# Patient Record
Sex: Male | Born: 1980 | Race: White | Hispanic: No | Marital: Married | Smoking: Never smoker
Health system: Southern US, Community
[De-identification: ages and names within clinical notes are randomized; demographics above are authoritative.]

## PROBLEM LIST (undated history)

## (undated) DIAGNOSIS — J45909 Unspecified asthma, uncomplicated: Secondary | ICD-10-CM

## (undated) HISTORY — PX: HERNIA REPAIR: SHX51

## (undated) HISTORY — PX: VASECTOMY: SHX75

---

## 2010-07-10 NOTE — H&P (Addendum)
[facility]                 [address] [place], [place]   [address]                                   PREOPERATIVE                        HISTORY   PHYSICAL EXAMINATION    PATIENT:      [name] [name], [name] [name]  MRN:              [id number]     ADMITTED: 07/20/2010  BILLING:          [id number]    LOCATION:  ATTENDING:    Maurie Boettcher, MD  DICTATING:    Maurie Boettcher, MD      HISTORY OF PRESENT ILLNESS: The patient is a 30 year old male who, about 2  weeks ago, went on a run. When he got back from this run and was leaning  against the counter, he felt a sore area in the right groin. When he looked  down, he was able to see a bulge or a lump on that side. When he laid down  and applied pressure, this did go away. He has not been noting this as  much, but he does say whenever it is sore, he has been holding pressure, so  he feels like he may not be seeing any problems.    PAST MEDICAL HISTORY: None.    PAST SURGICAL HISTORY: None.    MEDICATIONS: Claritin-D as needed.    ALLERGIES: THE PATIENT HAS NO KNOWN DRUG ALLERGIES.    SOCIAL HISTORY: He is running and hoping to train for marathons. He does  use chewing tobacco, but no smoked tobacco. He does not drink alcohol. He  is married and has a 81-month-old baby.    FAMILY HISTORY: Significant for a father with diabetes. He tells me that  his father has a very thin stature as well, so he has always been watched  close. On his father's side, there are a lot of people with adult onset  diabetes.    REVIEW OF SYSTEMS  GASTROINTESTINAL: No problems with his bowels.  CONSTITUTIONAL: His weight has been stable.  INTEGUMENTARY: He does have a rash to the sides of his nose.  HEENT: No problems with vision.  CARDIAC: No chest pain or heart problems.  PULMONARY: No cough or shortness of breath.  GENITOURINARY: He does have a history of kidney stones which he says   occurred during [employer] training when he had a 40 pound weight loss at the  same time. He has not had any BPH symptoms.  MUSCULOSKELETAL: No arthritis.  ENDOCRINE: No history of diabetes personally.  HEMATOLOGIC: No problems with bruising or bleeding.  NEUROLOGIC: No dizziness or headaches.    PHYSICAL EXAMINATION  VITAL SIGNS: Temperature 98.1, with a heart rate 70, a blood pressure  120/70, with height 6 feet, weight 170 pounds, and a BMI of 23.  GENERAL: The patient is a thin, Caucasian male in no apparent distress.  HEAD: Normocephalic and atraumatic.  NECK: No masses or adenopathy.  SKIN: Does reveal a reddish rash to either side of the nose, which is only  a couple of centimeters on each side.  CARDIAC: Regular on exam.  PULMONARY: Clear to auscultation bilaterally.  ABDOMEN:  Nontender with the exception of when he coughs with pressure in  the right groin area. There is a bulge which does not go all the way down  to the scrotum. On the left hand side, there is no tenderness and no bulge  identified.  EXTREMITIES: No evidence of edema.    ASSESSMENT AND PLAN: The patient is a 30 year old male with a right  inguinal hernia. I had a long conversation with him about the options  between an open or a laparoscopic repair. He really prefers to proceed with  a laparoscopic repair knowing the risks of bleeding, infection, numbness,  pain, and recurrence specifically. Occasionally, this cannot be done  laparoscopically, and does need to be done open. I did discuss with him  that in the future, should he have a left inguinal hernia, that sometimes  it is not possible to proceed laparoscopically, after having had a  right-sided repair. The patient understands and wants to proceed as soon as  possible. I did discuss with him that this is done under a general  anesthetic and a Foley catheter is used so that he would have to void prior  to going home. I did encourage him to take at least a week or 2 off work,   but he really prefers to have this done and try to proceed to work the  following week if possible. I did discuss with him the risks of oral  tobacco and recommended cessation. His skin rash on his face does have an  appearance of possible rosacea which should not prevent proceeding with  surgery. He will follow up with his primary care to re-evaluate this.                           Electronically Signed       Maurie Boettcher, MD 07/12/2010 14:53              Maurie Boettcher, MD    VAS:wmx  D: 07/10/2010  9:37 A T: 07/10/2010  9:55 A  Job#:  [id number]  CScriptDoc #:  [id number]  cc:   Maurie Boettcher, MD

## 2010-07-20 NOTE — Op Note (Signed)
[facility]                  [address] [place], [place]  [address]                                 OPERATIVE REPORT    PATIENT:     [name], [name]  MRN              [id number]   DATE:       07/20/2010  BILLING:         [id number]  LOCATION:  ATTENDING:   Maurie Boettcher, MD  SURGEON:     [name] Boettcher, MD      PREOPERATIVE DIAGNOSIS: Right inguinal hernia.    POSTOPERATIVE DIAGNOSIS: Right inguinal hernia.    PROCEDURE: Laparoscopic right inguinal herniorrhaphy.    SURGEON: [name] Boettcher, MD    ASSISTANT: [name] [name], SA    ANESTHESIA: General endotracheal anesthesia.    FINDINGS: Indirect hernia.    COMPLICATIONS: None.    PATHOLOGIC SPECIMENS: None.    ESTIMATED BLOOD LOSS: Minimal.    IV FLUIDS:  1400 mL.    URINE OUTPUT: 130 mL.    INDICATIONS: The patient is a 30 year old male who about 2 weeks ago  noticed soreness and a bulge in the right groin. Whenever the area is sore  and he holds pressure on it, it seems to resolve. He did have a hernia  noted on exam. We discussed the option of open versus laparoscopic repair,  and he preferred to proceed with a laparoscopic herniorrhaphy, knowing the  risks of bleeding, infection, injury, numbness, pain, recurrence, and  possible need to open.    DESCRIPTION OF PROCEDURE: The patient was taken to the operating room and  placed supine on the operating room table. Sequential compression devices  were applied. Both arms were padded and tucked. A general endotracheal  anesthetic was induced. The hair around the umbilicus and down to the pubic  bone was clipped and a Foley catheter was placed. He was prepped with  Chloraprep and draped in a sterile fashion. Marcaine 0.5% with epinephrine  was infused as an ilioinguinal nerve block. A time-out was held and all  agreed with the plan, patient, and procedure.    Marcaine 0.5% with epinephrine was infused to the right of the umbilicus.  An incision was made. We  dissected down to the level of the fascia and  opened this. The rectus muscle was retracted laterally. The dissecting  balloon was inserted into the preperitoneal space and expanded on the right  hand side.    At this point, the structural balloon was inserted, and it was expanded.  The preperitoneal space was insufflated to a pressure of 12. We did  position the patient head down. Following local, two 5 mm trocars were  placed in the midline inferior to this.  I did have to cauterize a  subcutaneous bleed prior to placing one of the trocars.    I dissected out the lateral space. I dissected off the pubic tubercle and  down to the Cooper's ligament. At this point, I began to dissect out the  hernia sac. This was fairly scarred to the cord structures, but I was able  to clear around this and bring this down proximally. It did appear to be  tethered. As I examined this area where it was tethered, it  was very clear  that this was not a hernia and was the peritoneum going down to the pelvis.  During the dissection there was an opening in the peritoneum noted. There  was nothing noted in the direct space.    I did take the 3 x 6 polypropylene mesh and marked this and cut a vertical  slit. This was placed in the preperitoneal space. I did place this around  the cord structures. A tack was placed to the pubic tubercle and down along  Cooper's ligament. Tacks were placed from the midline over to the  epigastrics and the lateral flap was wrapped around and tacked on either  side of the epigastrics as well. This created a new internal ring.    At this point, the opening in the peritoneum was large enough that a loop  of small bowel was visible. I was not comfortable with not closing the  peritoneum as I was concerned the loop of bowel might be in contact with  the mesh. I, therefore, took a 3-0 Vicryl stitch and laparoscopically ran  the Vicryl stitch to reapproximate the peritoneum.    At this point, the mesh was held in  place. I did evacuate the gas from the  preperitoneal space. Our mesh was in good position. At this point our  structural balloon was removed.    An 0 Vicryl UR-6 stitch was used to close the fascia. I then infused  additional local using a total of 30 mL. Some of this had been infused into  the preperitoneal space as well. 4-0 Monocryl buried interrupted in running  subcuticular stitches were used to close the skin followed by alcohol,  Steri-Strips, 4 x 4 and Medipore tape. The Foley catheter was removed.  There was noted to be a little bit of subcutaneous air on the right hand  side of his abdomen. He went to the recovery room in a stable condition.                        Electronically Signed      [name] Boettcher, MD 07/26/2010 10:54                                     [name] Boettcher, MD    VAS:WMX  D: 07/20/2010 12:02 P T: 07/20/2010 12:30 P  Job#:  [id number]  CScriptDoc #:  [id number]  cc:   [name] Boettcher, MD

## 2010-07-20 NOTE — Op Note (Signed)
[facility]                  [address], [place], [place]  [address]                                 OPERATIVE REPORT    PATIENT:     [name] [name], [name] [name]  MRN              [id number]   DATE:       07/20/2010  BILLING:         [id number]  LOCATION:  ATTENDING:   Maurie Boettcher, MD  SURGEON:     Maurie Boettcher, MD      PREOPERATIVE DIAGNOSIS: Right inguinal hernia.    POSTOPERATIVE DIAGNOSIS: Right inguinal hernia.    PROCEDURE: Laparoscopic right inguinal herniorrhaphy.    SURGEON: Maurie Boettcher, MD    ASSISTANT: Guss Bunde, SA    ANESTHESIA: General endotracheal anesthesia.    FINDINGS: Indirect hernia.    COMPLICATIONS: None.    PATHOLOGIC SPECIMENS: None.    ESTIMATED BLOOD LOSS: Minimal.    IV FLUIDS:  1400 mL.    URINE OUTPUT: 130 mL.    INDICATIONS: The patient is a 30 year old male who about 2 weeks ago  noticed soreness and a bulge in the right groin. Whenever the area is sore  and he holds pressure on it, it seems to resolve. He did have a hernia  noted on exam. We discussed the option of open versus laparoscopic repair,  and he preferred to proceed with a laparoscopic herniorrhaphy, knowing the  risks of bleeding, infection, injury, numbness, pain, recurrence, and  possible need to open.    DESCRIPTION OF PROCEDURE: The patient was taken to the operating room and  placed supine on the operating room table. Sequential compression devices  were applied. Both arms were padded and tucked. A general endotracheal  anesthetic was induced. The hair around the umbilicus and down to the pubic  bone was clipped and a Foley catheter was placed. He was prepped with  Chloraprep and draped in a sterile fashion. Marcaine 0.5% with epinephrine  was infused as an ilioinguinal nerve block. A time-out was held and all  agreed with the plan, patient, and procedure.    Marcaine 0.5% with epinephrine was infused to the right of the umbilicus.   An incision was made. We dissected down to the level of the fascia and  opened this. The rectus muscle was retracted laterally. The dissecting  balloon was inserted into the preperitoneal space and expanded on the right  hand side.    At this point, the structural balloon was inserted, and it was expanded.  The preperitoneal space was insufflated to a pressure of 12. We did  position the patient head down. Following local, two 5 mm trocars were  placed in the midline inferior to this.  I did have to cauterize a  subcutaneous bleed prior to placing one of the trocars.    I dissected out the lateral space. I dissected off the pubic tubercle and  down to the Cooper's ligament. At this point, I began to dissect out the  hernia sac. This was fairly scarred to the cord structures, but I was able  to clear around this and bring this down proximally. It did appear to be  tethered. As I examined this area where it was tethered, it  was very clear  that this was not a hernia and was the peritoneum going down to the pelvis.  During the dissection there was an opening in the peritoneum noted. There  was nothing noted in the direct space.    I did take the 3 x 6 polypropylene mesh and marked this and cut a vertical  slit. This was placed in the preperitoneal space. I did place this around  the cord structures. A tack was placed to the pubic tubercle and down along  Cooper's ligament. Tacks were placed from the midline over to the  epigastrics and the lateral flap was wrapped around and tacked on either  side of the epigastrics as well. This created a new internal ring.    At this point, the opening in the peritoneum was large enough that a loop  of small bowel was visible. I was not comfortable with not closing the  peritoneum as I was concerned the loop of bowel might be in contact with  the mesh. I, therefore, took a 3-0 Vicryl stitch and laparoscopically ran  the Vicryl stitch to reapproximate the peritoneum.     At this point, the mesh was held in place. I did evacuate the gas from the  preperitoneal space. Our mesh was in good position. At this point our  structural balloon was removed.    An 0 Vicryl UR-6 stitch was used to close the fascia. I then infused  additional local using a total of 30 mL. Some of this had been infused into  the preperitoneal space as well. 4-0 Monocryl buried interrupted in running  subcuticular stitches were used to close the skin followed by alcohol,  Steri-Strips, 4 x 4 and Medipore tape. The Foley catheter was removed.  There was noted to be a little bit of subcutaneous air on the right hand  side of his abdomen. He went to the recovery room in a stable condition.                        Electronically Signed      Maurie Boettcher, MD 07/26/2010 10:54                                     Maurie Boettcher, MD    VAS:WMX  D: 07/20/2010 12:02 P T: 07/20/2010 12:30 P  Job#:  [id number]  CScriptDoc #:  [id number]  cc:   Maurie Boettcher, MD

## 2014-04-26 ENCOUNTER — Ambulatory Visit: Admit: 2014-04-26 | Payer: PRIVATE HEALTH INSURANCE | Attending: Urology | Primary: Family Medicine

## 2014-04-26 DIAGNOSIS — Z3009 Encounter for other general counseling and advice on contraception: Secondary | ICD-10-CM

## 2014-04-26 MED ORDER — CEPHALEXIN 500 MG CAP
500 mg | ORAL_CAPSULE | Freq: Two times a day (BID) | ORAL | Status: AC
Start: 2014-04-26 — End: 2014-05-01

## 2014-04-26 MED ORDER — DIAZEPAM 10 MG TAB
10 mg | ORAL_TABLET | ORAL | Status: AC | PRN
Start: 2014-04-26 — End: ?

## 2014-04-26 MED ORDER — OXYCODONE-ACETAMINOPHEN 5 MG-325 MG TAB
5-325 mg | ORAL_TABLET | Freq: Four times a day (QID) | ORAL | Status: AC | PRN
Start: 2014-04-26 — End: ?

## 2014-04-26 NOTE — Progress Notes (Signed)
Chief Complaint   Patient presents with   ??? Referral / Consult     Vasectomy        Vasectomy Consult    [name] [name] is a 34 y.o. male here for pre-vasectomy consultation.  He was referred by friend.     He has been married for 11  years.  He has 3 children.    Male: 35 years old  Male: 522 months old  Male: 81 month old      He reports normal erections  He reports normal ejaculation  He has no history of scrotal swelling or pain      History reviewed. No pertinent past medical history.      Past Surgical History   Procedure Laterality Date   ??? Hx hernia repair  2012               No Known Allergies      History     Social History   ??? Marital Status: UNKNOWN     Spouse Name: N/A   ??? Number of Children: N/A   ??? Years of Education: N/A     Occupational History   ??? Not on file.     Social History Main Topics   ??? Smoking status: Former Smoker   ??? Smokeless tobacco: Not on file   ??? Alcohol Use: No   ??? Drug Use: No   ??? Sexual Activity: Not on file     Other Topics Concern   ??? Not on file     Social History Narrative         Family History   Problem Relation Age of Onset   ??? Family history unknown: Yes         REVIEW OF SYSTEMS    Constitutional: Fever: No  Skin: Rash: No  HEENT: Hearing difficulty: No  Eyes: Blurred vision: No  Cardiovascular: Chest pain: No  Respiratory: Shortness of breath: No  Gastrointestinal: Nausea/vomiting: No  Musculoskeletal: Back pain:   Neurological: Weakness: No  Psychological: Memory loss: No  Comments/additional findings:        PHYSICAL EXAM    BP 118/70 mmHg   Ht 6\' 1"  (1.854 m)   Wt 170 lb (77.111 kg)   BMI 22.43 kg/m2  Constitutional: WDWN, Pleasant and appropriate affect, No acute distress.    CV:  No peripheral swelling noted  Respiratory: No respiratory distress or difficulties  Abdomen:  No abdominal masses or tenderness.  No CVA tenderness. No hernias  GU Male:     Scrotum:   normal  Vas:  easily  palpable  Epididymides: normal  Testes:   normal  Urethral Meatus:   normal   Penis:  normal  Skin: No jaundice.    Neuro/Psych:  Alert and oriented x 3. Affect appropriate.   Lymphatic:   No enlarged inguinal lymph nodes.           ASSESSMENT:  Encounter Diagnosis   Name Primary?   ??? Sterilization consult Yes         He was given the consent form, pre-vasectomy instruction sheet, and vasectomy booklet.   I extensively reviewed with him the likely postoperative recuperative period as well as the need to continue to use contraception until he is notified by us of his sterility.   He will have a semen analysis after 2 months.   He understands the potential side effects of anesthesia, bleeding, scrotal hematoma, wound infection, epididymo-orchitis, epididymal congestion, chronic testicular pain  requiring further surgery, sperm granuloma, antisperm antibodies, early recanalization, spontaneous recanalization with pregnancy after demonstration of azoospermia and the possible association with prostate cancer.   He is aware of alternatives to vasectomy.  He has given this careful consideration and wishes to proceed with a vasectomy.  He is aware he will need transportation following the procedure. We will schedule his vasectomy per his request.    Rx given for Keflex, Percocet and Valium             VBrugh

## 2014-05-08 ENCOUNTER — Ambulatory Visit
Admit: 2014-05-08 | Discharge: 2014-05-08 | Payer: PRIVATE HEALTH INSURANCE | Attending: Urology | Primary: Family Medicine

## 2014-05-08 DIAGNOSIS — Z302 Encounter for sterilization: Secondary | ICD-10-CM

## 2014-05-08 NOTE — Progress Notes (Signed)
No Scalpel Vasectomy Procedure Note    Indications: 33 y.o. male desiring permanent sterilization    Pre-operative Diagnosis: Undesired fertility    Post-operative Diagnosis: Undesired fertility    Anesthesia: Lidocaine 2% without epinephrine    Procedure Details     The risks and benefits of the procedure were discussed at the pre-procedure consultation, and written, informed consent obtained.    Premedicated  Valium 10 mgm po 30 minutes prior to procedure.    The scrotum was palpated with both testes normal in size and position, no masses palpitated. The midline scrotum was prepped with alcohol and using the Jet Injector the skin and vas deferens were anesthesized.  The scrotum was cleansed with warm betadine and draped in the usual sterile manner.     A vasal sheath block was performed on both the left and right vas.  After adequate anesthesia was established, a small perforation was made in the midline scrotal skin and the right vas was isolated with the ring forceps, dissected free and delivered through the skin perforation.  The right vas was divided, approximately 1.5 cm portion removed, and each end of the vas was cauterized and hemoclips applied.  The ends of the vas were replaced in the scrotum through the puncture site.  The left vas was then isolated, divided, cauterized and ligated in a similar fashion, through the same incision.  Midportions removed were not sent to pathology to confirm.    Any bleeding was controlled with electrocautery or suture ligation.  The puncture site was dry and temporarily clamped for hemostasis when the procedure was completed.  Dry sterile dressings were applied to scrotal area and jock strap applied.      Condition:  Stable    Complications:  none.    Plan:  1. Continue contraception until negative sperm analysis. Semen count between 6 to 12 weeks  2. Warning signs of infection were reviewed.   3. Patient is taken home by wife with written home care instructions.   Bedrest X 48 hrs, Ice pack every 3 hours for 48 hrs.    Call the clinic if excessive pain, bleeding or swelling.  No lifting >10 lbs or sexual activity for 1 week.   4. Recommended that the patient use Percocet as needed for pain.   5. Take antibiotics as perscribed.        VBrugh

## 2014-05-09 NOTE — Telephone Encounter (Signed)
I called [name] [name] f/u with him regarding his vasectomy 2 days ago    Overall, doing well.  He reports tenderness without swelling    No problems with his incsion    Answered questions    SA in 2 months as planned    VBrugh

## 2014-07-16 ENCOUNTER — Encounter: Primary: Family Medicine

## 2014-07-16 ENCOUNTER — Institutional Professional Consult (permissible substitution): Admit: 2014-07-16 | Discharge: 2014-07-16 | Payer: PRIVATE HEALTH INSURANCE | Primary: Family Medicine

## 2014-07-16 DIAGNOSIS — Z9852 Vasectomy status: Secondary | ICD-10-CM

## 2014-07-16 NOTE — Progress Notes (Signed)
Post Vas SA 1  no sperm present in pellet.  Patient may discontinue contraception - yes  F/U for semen analysis in 1 month - no    VBrugh

## 2014-07-16 NOTE — Progress Notes (Signed)
[name] [name] is here to leave semen specimen for semen analysis to be done per Dr. Esmeralda ArthurBrugh order.   This is patient's first analysis post his vasectomy on 05/08/14.   Patient notes that his specimen was collected less than 1 hours ago.      Semen specimen labeled with proper label, bagged, and placed in incubator. Lab staff made aware that semen specimen is ready for pick up.  Patient will be made aware of results.

## 2014-07-19 NOTE — Progress Notes (Signed)
Phoned patient at [phone] (home) [phone] (work) and informed them of his negative SA, to d/c any contraceptive devices and no follow up is needed, he agreed in understanding, thanked me for the call and it was ended.

## 2017-08-07 ENCOUNTER — Ambulatory Visit: Payer: BLUE CROSS/BLUE SHIELD | Admitting: Family Medicine

## 2017-08-07 ENCOUNTER — Encounter: Payer: Self-pay | Admitting: Family Medicine

## 2017-08-07 ENCOUNTER — Telehealth: Payer: Self-pay | Admitting: Family Medicine

## 2017-08-07 ENCOUNTER — Ambulatory Visit
Admission: RE | Admit: 2017-08-07 | Discharge: 2017-08-07 | Disposition: A | Discharge status: Home / Self Care | Payer: BLUE CROSS/BLUE SHIELD | Source: Ambulatory Visit | Attending: Family Medicine | Admitting: Family Medicine

## 2017-08-07 DIAGNOSIS — M79604 Pain in right leg: Secondary | ICD-10-CM | POA: Diagnosis present

## 2017-08-07 DIAGNOSIS — S86819A Strain of other muscle(s) and tendon(s) at lower leg level, unspecified leg, initial encounter: Secondary | ICD-10-CM | POA: Diagnosis present

## 2017-08-07 HISTORY — DX: Strain of other muscle(s) and tendon(s) at lower leg level, unspecified leg, initial encounter: S86.819A

## 2017-08-07 NOTE — Addendum Note (Signed)
Addended by: Marshall CorkBULLOCK, KERI L on: 08/07/2017 08:52 AM   Modules accepted: Orders

## 2017-08-07 NOTE — Progress Notes (Signed)
   There were no vitals taken for this visit.   Subjective:    Patient ID: Paul Vaughn, male    DOB: 12/26/1980, 37 y.o.   MRN: [id number]  HPI: Paul Vaughn is a 37 y.o. male  Chief Complaint  Patient presents with  . Leg Pain    Started Saturday, Injury  Patient with over exercising straining right calf.  Now with marked swelling inflammation redness.  This is posterior knee area but projects up into the posterior knee. Discussed with Dr. Wyn Quakerew of [facility] and recommending venous ultrasound for DVT.  Relevant past medical, surgical, family and social history reviewed and updated as indicated. Interim medical history since our last visit reviewed. Allergies and medications reviewed and updated.  Review of Systems  Constitutional: Negative.   Respiratory: Negative.   Cardiovascular: Negative.     Per HPI unless specifically indicated above     Objective:    There were no vitals taken for this visit.  Wt Readings from Last 3 Encounters:  No data found for Wt    Physical Exam  Constitutional: He is oriented to person, place, and time. He appears well-developed and well-nourished.  HENT:  Head: Normocephalic and atraumatic.  Eyes: Conjunctivae and EOM are normal.  Neck: Normal range of motion.  Cardiovascular: Normal rate, regular rhythm and normal heart sounds.  Pulmonary/Chest: Effort normal and breath sounds normal.  Musculoskeletal: Normal range of motion.  Calf area swollen inflamed may be slight yellowish discoloration. Also exquisitely tender patient limping  Neurological: He is alert and oriented to person, place, and time.  Skin: No erythema.  Psychiatric: He has a normal mood and affect. His behavior is normal. Judgment and thought content normal.    No results found for this or any previous visit.    Assessment & Plan:   Problem List Items Addressed This Visit      Musculoskeletal and Integument   Strain of calf muscle, initial  encounter    Significant strain of calf muscle right Concerned about developing DVT      Relevant Orders   VAS US LOWER EXTREMITY VENOUS (DVT)       Follow up plan: Return if symptoms worsen or fail to improve.

## 2017-08-07 NOTE — Telephone Encounter (Signed)
Morrie Sheldonshley called with the results of the ultrasound on patients right leg which was negative for DVT

## 2017-08-07 NOTE — Assessment & Plan Note (Signed)
Significant strain of calf muscle right Concerned about developing DVT

## 2018-03-17 ENCOUNTER — Other Ambulatory Visit: Payer: Self-pay | Admitting: Family Medicine

## 2018-03-17 MED ORDER — AMOXICILLIN-POT CLAVULANATE 875-125 MG PO TABS: 1.0000 | ORAL_TABLET | Freq: Two times a day (BID) | ORAL | 0 refills | Status: DC

## 2018-03-17 MED ORDER — BENZONATATE 100 MG PO CAPS: 100.0000 mg | ORAL_CAPSULE | Freq: Two times a day (BID) | ORAL | 0 refills | Status: DC | PRN

## 2018-09-07 IMAGING — US US EXTREM LOW VENOUS*R*
1 series · 13 of 24 positions shown · non-contrast
Comparison: None.

CLINICAL DATA: Right lower extremity pain and edema for the past 5
days. Evaluate for DVT.



[Series 1: us extrem low venous*right* · 0.08mm/px · 13 of 42 slices shown]
[im 1/42]
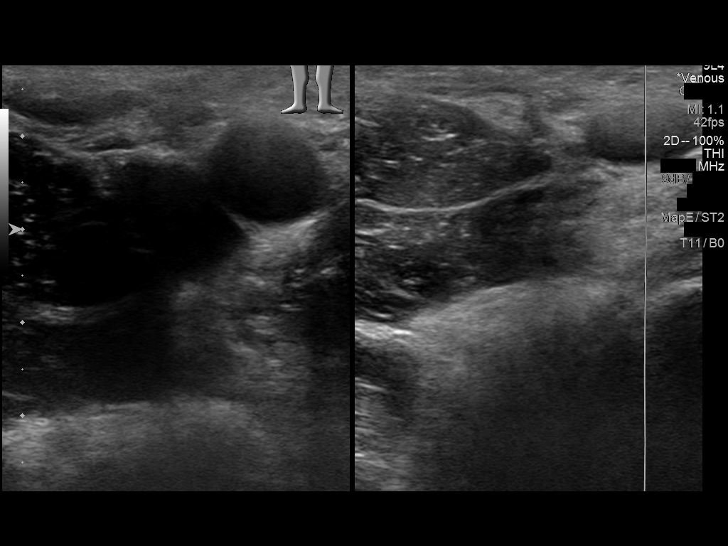
[im 4/42]
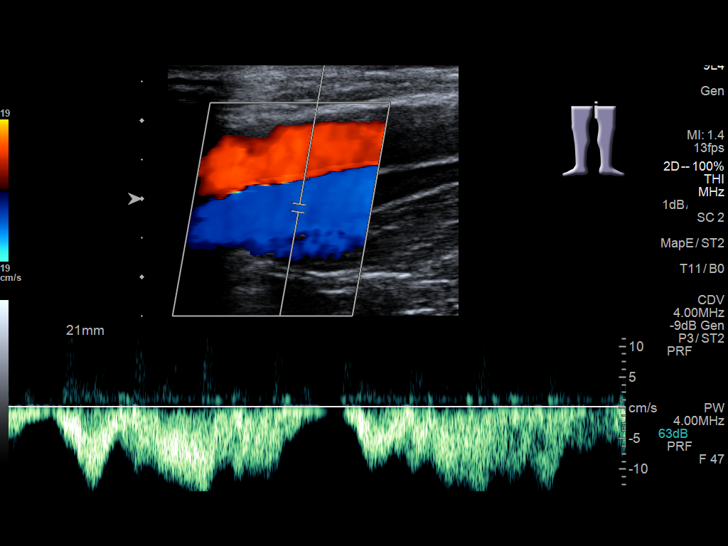
[im 8/42]
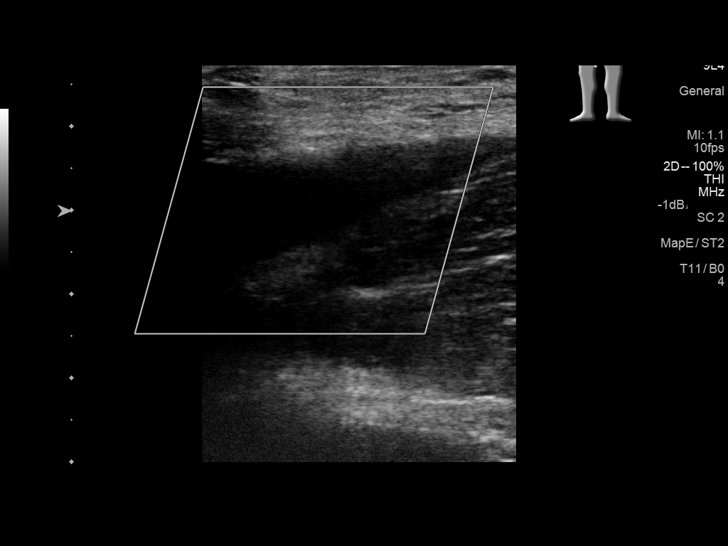
[im 11/42]
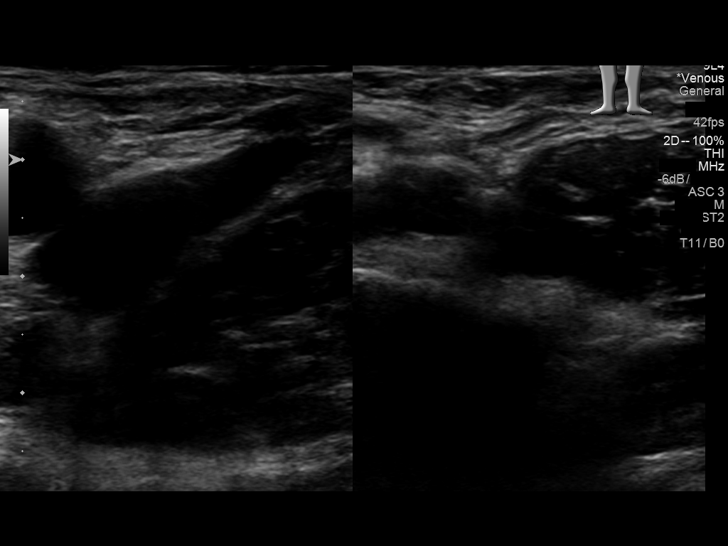
[im 15/42]
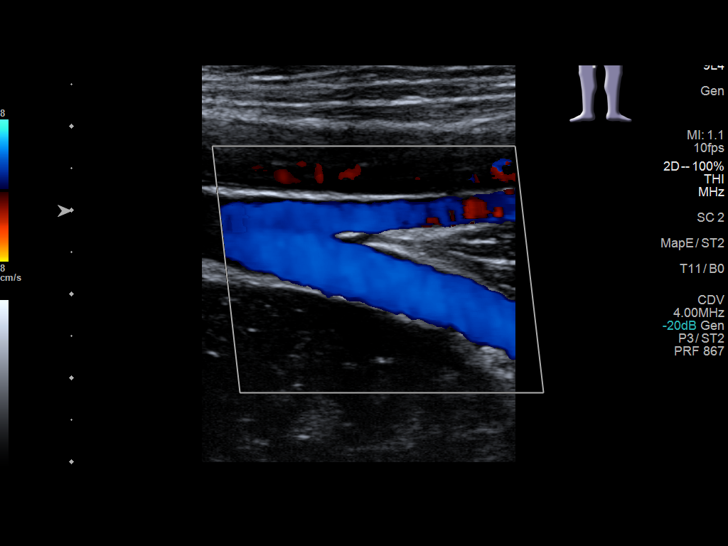
[im 18/42]
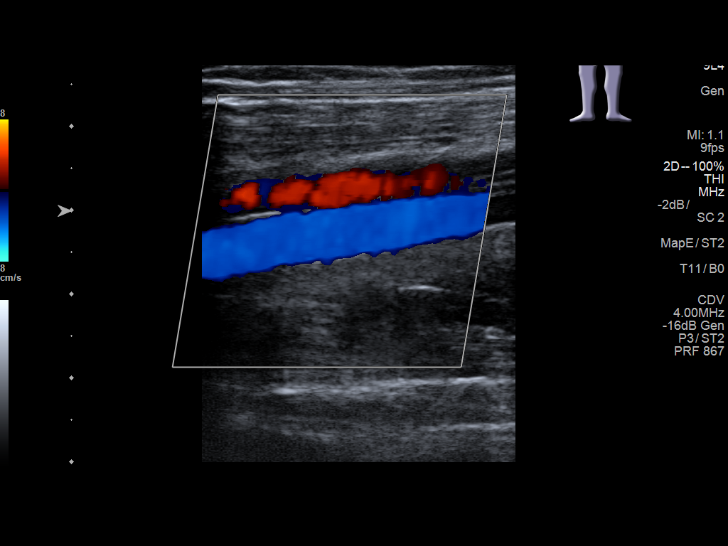
[im 22/42]
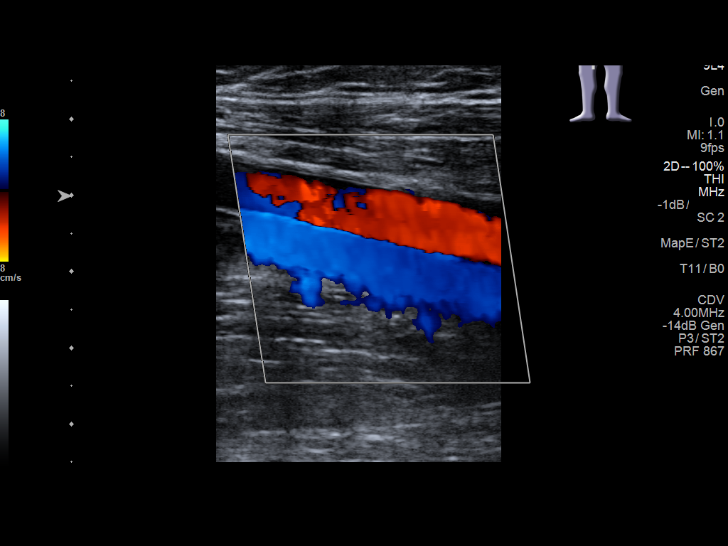
[im 24/42]
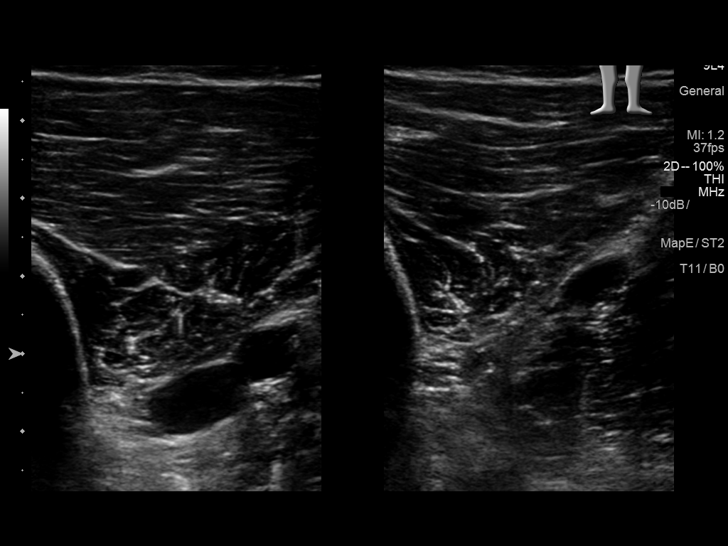
[im 27/42]
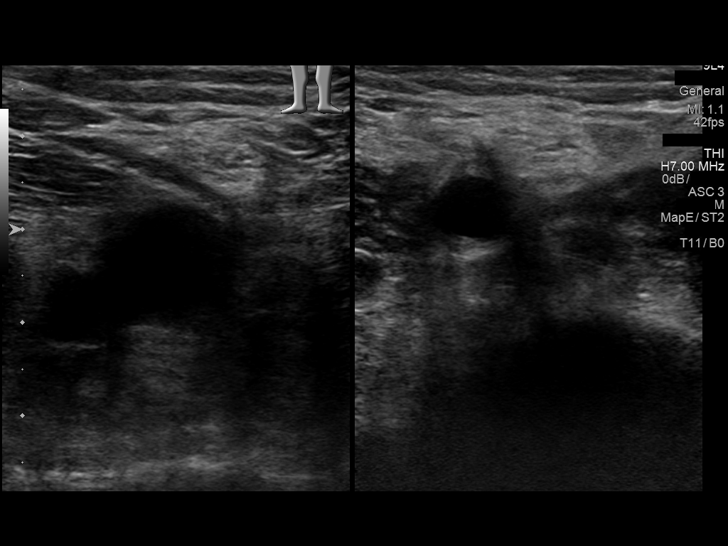
[im 31/42]
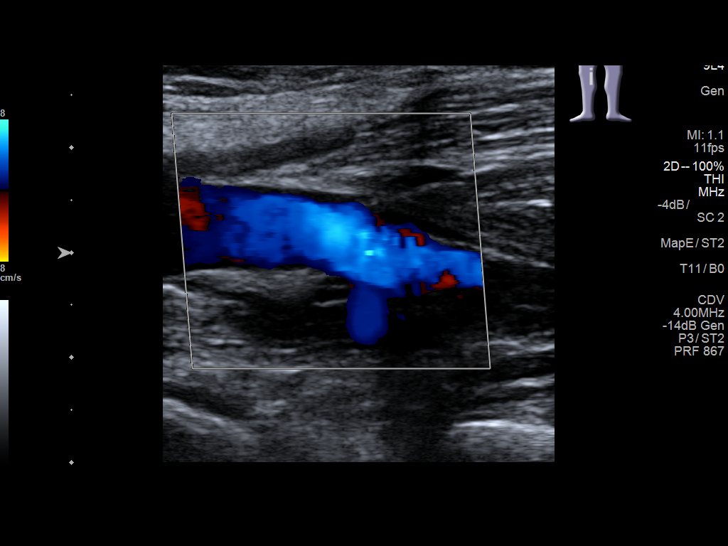
[im 34/42]
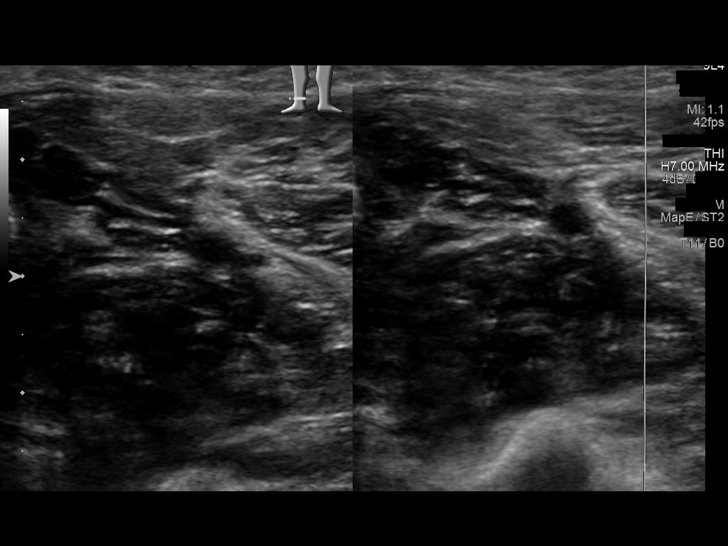
[im 38/42]
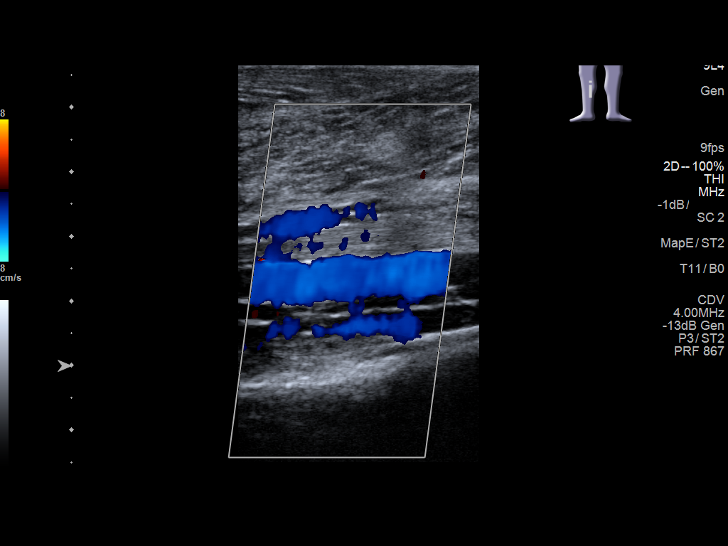
[im 42/42]
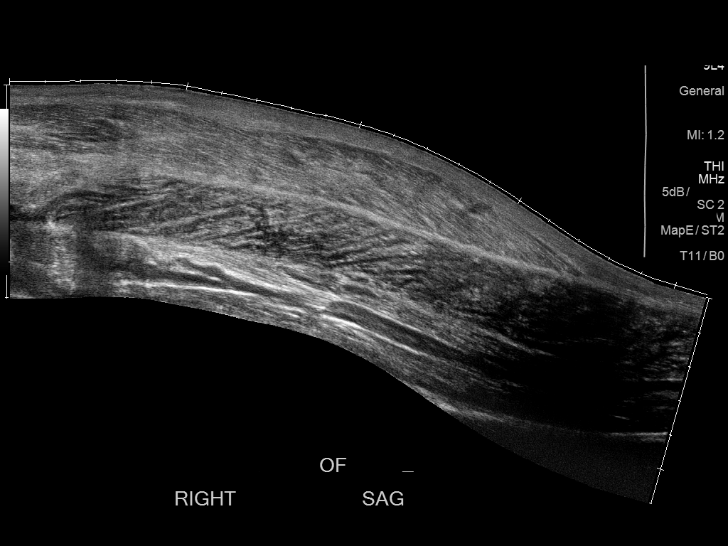

[13 of 24 positions shown; findings below may reference images not displayed]

FINDINGS: Contralateral Common Femoral Vein: Respiratory phasicity is normal
and symmetric with the symptomatic side. No evidence of thrombus.
Normal compressibility.

Common Femoral Vein: No evidence of thrombus. Normal
compressibility, respiratory phasicity and response to augmentation.

Saphenofemoral Junction: No evidence of thrombus. Normal
compressibility and flow on color Doppler imaging.

Profunda Femoral Vein: No evidence of thrombus. Normal
compressibility and flow on color Doppler imaging.

Femoral Vein: No evidence of thrombus. Normal compressibility,
respiratory phasicity and response to augmentation.

Popliteal Vein: No evidence of thrombus. Normal compressibility,
respiratory phasicity and response to augmentation.

Calf Veins: No evidence of thrombus. Normal compressibility and flow
on color Doppler imaging.

Superficial Great Saphenous Vein: No evidence of thrombus. Normal
compressibility.

Venous Reflux:  None.

Other Findings: No definitive sonographic correlate for patient's
area of pain involving the right posterior calf (images 42 and 43).
IMPRESSION: No evidence of DVT within the right lower extremity.

## 2020-07-27 DIAGNOSIS — J209 Acute bronchitis, unspecified: Secondary | ICD-10-CM | POA: Diagnosis not present

## 2020-07-27 DIAGNOSIS — B9689 Other specified bacterial agents as the cause of diseases classified elsewhere: Secondary | ICD-10-CM | POA: Diagnosis not present

## 2020-07-27 DIAGNOSIS — J019 Acute sinusitis, unspecified: Secondary | ICD-10-CM | POA: Diagnosis not present

## 2021-04-17 DIAGNOSIS — F4322 Adjustment disorder with anxiety: Secondary | ICD-10-CM | POA: Diagnosis not present

## 2021-05-02 DIAGNOSIS — F4322 Adjustment disorder with anxiety: Secondary | ICD-10-CM | POA: Diagnosis not present

## 2021-05-15 DIAGNOSIS — F4322 Adjustment disorder with anxiety: Secondary | ICD-10-CM | POA: Diagnosis not present

## 2021-06-27 ENCOUNTER — Telehealth: Payer: Self-pay | Admitting: Podiatry

## 2021-06-27 NOTE — Telephone Encounter (Signed)
Called patient Paul Vaughn to schedule him an appointment to see Dr. Al Corpus in [place] on 4/27 at 8:00am ?

## 2021-08-16 ENCOUNTER — Encounter: Payer: Self-pay | Admitting: Emergency Medicine

## 2021-08-16 ENCOUNTER — Ambulatory Visit
Admission: EM | Admit: 2021-08-16 | Discharge: 2021-08-16 | Disposition: A | Discharge status: Home / Self Care | Payer: BC Managed Care – PPO | Attending: Emergency Medicine | Admitting: Emergency Medicine

## 2021-08-16 DIAGNOSIS — J0100 Acute maxillary sinusitis, unspecified: Secondary | ICD-10-CM

## 2021-08-16 MED ORDER — AMOXICILLIN-POT CLAVULANATE 875-125 MG PO TABS: 1.0000 | ORAL_TABLET | Freq: Two times a day (BID) | ORAL | 0 refills | Status: AC

## 2021-08-16 NOTE — Discharge Instructions (Signed)
Take Augmentin twice daily for seven days.  

## 2021-08-16 NOTE — ED Triage Notes (Signed)
Pt presents with sinus pressure/pain and runny nose x 3 days

## 2021-08-16 NOTE — ED Provider Notes (Signed)
Patient Contact: 8:20 PM (approximate)   History   Sinus Pressure and Nasal Congestion   HPI  Paul Vaughn is a 41 y.o. male presents to the urgent care with maxillary sinus tenderness for the past 5 days.  No associated cough, fever, vomiting or diarrhea.  Patient states that he experiences a sinus infection during the spring and fall months and is taking Zyrtec daily.      Physical Exam   Triage Vital Signs: ED Triage Vitals  Enc Vitals Group     BP 08/16/21 2005 126/87     Pulse Rate 08/16/21 2005 74     Resp 08/16/21 2005 16     Temp 08/16/21 2005 98 F (36.7 C)     Temp Source 08/16/21 2005 Oral     SpO2 08/16/21 2005 96 %     Weight --      Height --      Head Circumference --      Peak Flow --      Pain Score 08/16/21 2003 3     Pain Loc --      Pain Edu? --      Excl. in GC? --     Most recent vital signs: Vitals:   08/16/21 2005  BP: 126/87  Pulse: 74  Resp: 16  Temp: 98 F (36.7 C)  SpO2: 96%     General: Alert and in no acute distress. Eyes:  PERRL. EOMI. Head: No acute traumatic findings ENT:      Ears: Tms are effused bilaterally.       Nose: No congestion/rhinnorhea.      Mouth/Throat: Mucous membranes are moist.  Patient has not Celerity sinus tenderness. Neck: No stridor. No cervical spine tenderness to palpation. Cardiovascular:  Good peripheral perfusion Respiratory: Normal respiratory effort without tachypnea or retractions. Lungs CTAB. Good air entry to the bases with no decreased or absent breath sounds. Gastrointestinal: Bowel sounds 4 quadrants. Soft and nontender to palpation. No guarding or rigidity. No palpable masses. No distention. No CVA tenderness. Musculoskeletal: Full range of motion to all extremities.  Neurologic:  No gross focal neurologic deficits are appreciated.  Skin:   No rash noted    ED Results / Procedures / Treatments   Labs (all labs ordered are listed, but only abnormal results are  displayed) Labs Reviewed - No data to display      PROCEDURES:  Critical Care performed: No  Procedures   MEDICATIONS ORDERED IN ED: Medications - No data to display   IMPRESSION / MDM / ASSESSMENT AND PLAN / ED COURSE  I reviewed the triage vital signs and the nursing notes.                             Assessment and plan Sinusitis 41 year old male presents to the urgent care with maxillary sinus tenderness.  History and physical exam findings suggestive of sinusitis.  Will treat with Augmentin twice daily for the next 7 days.   FINAL CLINICAL IMPRESSION(S) / ED DIAGNOSES   Final diagnoses:  Acute non-recurrent maxillary sinusitis     Rx / DC Orders   ED Discharge Orders          Ordered    amoxicillin-clavulanate (AUGMENTIN) 875-125 MG tablet  2 times daily        08/16/21 2018             Note:  This document was prepared using  [employer] and may include unintentional dictation errors.   Pia Mau Lavin[place], [place] 08/16/21 2022

## 2021-12-27 DIAGNOSIS — B9689 Other specified bacterial agents as the cause of diseases classified elsewhere: Secondary | ICD-10-CM | POA: Diagnosis not present

## 2021-12-27 DIAGNOSIS — J069 Acute upper respiratory infection, unspecified: Secondary | ICD-10-CM | POA: Diagnosis not present

## 2022-02-28 ENCOUNTER — Ambulatory Visit: Payer: BC Managed Care – PPO | Admitting: Family

## 2022-02-28 ENCOUNTER — Encounter: Payer: Self-pay | Admitting: Family

## 2022-02-28 VITALS — BP 113/78 | HR 66 | Temp 97.6°F | Ht 72.0 in | Wt 184.8 lb

## 2022-02-28 DIAGNOSIS — J42 Unspecified chronic bronchitis: Secondary | ICD-10-CM | POA: Diagnosis not present

## 2022-02-28 NOTE — Assessment & Plan Note (Addendum)
lungs clear today hx of serving [employer], [employer] in [address] [place] [place], exposed to burn pits reports multiple allergens and has chronic nasal congestion, seen by ENT in past, wears a mask whenever he knows he may be exposed (dust, pollen, etc) has had 3 episodes of bronchitis this year, states it comes on quickly and he will cough continuously, currently on Augmentin exercises (runs) regularly, but unable when sick. States he sometimes can run for a long while (marathon distance) no cough while running and then afterwards he has continuous coughing. [facility] but not [facility] for anything advised on registering for website with sx for burn pit exposure referring to Pulmonary

## 2022-02-28 NOTE — Progress Notes (Signed)
New Patient Office Visit  Subjective:  Patient ID: Paul Vaughn, male    DOB: 26-Jan-1981  Age: 41 y.o. MRN: [id number]  CC:  Chief Complaint  Patient presents with   New Patient (Initial Visit)   Respiratory Distress    Pt c/o Bronchitis and sinusitis off and on for 6 months, Pt states he is now on his 3rd dose of antibiotics.     HPI Paul Vaughn presents for establishing care today.  Bronchitis:  seen last 12/24 and given abt, prior to that was in October and reports another episode prior to that this year. States he wakes up every day with nasal congestion, if he starts to cough he reports he starts "going downhill rapidly". Reports he has had PNA in his 30s about 3 times.  States Zpacks haven't worked in past. With past infections he will have SOB and chest tightness, has used Albuterol inhaler with mild relief, denies any of these sx today, but is currently taking Augmentin for current episode. He normally likes to run for exercise, but unable with active episode. Has seen ENT in past and had allergy panel & allergic to most everything, takes Zyrtec daily and has used steroid nasal spray with some success. Pt reports serving in the [employer] in [address] [place], also around burn pits, and concerned if current continuous sx are related. [facility]   Assessment & Plan:   Problem List Items Addressed This Visit       Respiratory   Chronic bronchitis (HCC) - Primary    lungs clear today hx of serving [employer], [employer] [place] in [address] [place], exposed to burn pits reports multiple allergens and has chronic nasal congestion, seen by ENT in past, wears a mask whenever he knows he may be exposed (dust, pollen, etc) has had 3 episodes of bronchitis this year, states it comes on quickly and he will cough continuously, currently on Augmentin exercises (runs) regularly, but unable when sick. States he sometimes can run for a long while (marathon distance) no cough while running and then  afterwards he has continuous coughing. [facility] but not [facility] for anything advised on registering for website with sx for burn pit exposure referring to Pulmonary      Relevant Orders   Ambulatory referral to Pulmonology   Subjective:    Outpatient Medications Prior to Visit  Medication Sig Dispense Refill   amoxicillin-clavulanate (AUGMENTIN) 875-125 MG tablet Take 1 tablet by mouth 2 (two) times daily.     benzonatate (TESSALON) 100 MG capsule Take 1 capsule (100 mg total) by mouth 2 (two) times daily as needed for cough. 20 capsule 0   cetirizine (ZYRTEC) 10 MG chewable tablet Chew 10 mg by mouth daily.     loratadine-pseudoephedrine (CLARITIN-D 24-HOUR) 10-240 MG 24 hr tablet Take 1 tablet by mouth daily.     No facility-administered medications prior to visit.   Past Medical History:  Diagnosis Date   Strain of calf muscle, initial encounter 08/07/2017   Past Surgical History:  Procedure Laterality Date   HERNIA REPAIR     2012   VASECTOMY     2016    Objective:   Today's Vitals: BP 113/78 (BP Location: Left Arm, Patient Position: Sitting, Cuff Size: Large)   Pulse 66   Temp 97.6 F (36.4 C) (Temporal)   Ht 6' (1.829 m)   Wt 184 lb 12.8 oz (83.8 kg)   SpO2 98%   BMI 25.06 kg/m  Physical Exam Vitals and nursing note reviewed.  Constitutional:      General: He is not in acute distress.    Appearance: Normal appearance.  HENT:     Head: Normocephalic.  Cardiovascular:     Rate and Rhythm: Normal rate and regular rhythm.  Pulmonary:     Effort: Pulmonary effort is normal.     Breath sounds: Normal breath sounds.  Musculoskeletal:        General: Normal range of motion.     Cervical back: Normal range of motion.  Skin:    General: Skin is warm and dry.  Neurological:     Mental Status: He is alert and oriented to person, place, and time.  Psychiatric:        Mood and Affect: Mood normal.     Jeanie Sewer, NP

## 2022-02-28 NOTE — Patient Instructions (Signed)
Welcome to [facility], It was a pleasure meeting you today!    I have sent a referral to our Pulmonary office.  Have a Happy New Year!     PLEASE NOTE: If you had any LAB tests please let us know if you have not heard back within a few days. You may see your results on MyChart before we have a chance to review them but we will give you a call once they are reviewed by [place]. If we ordered any REFERRALS today, please let us know if you have not heard from their office within the next week.  Let us know through MyChart if you are needing REFILLS, or have your pharmacy send [place] the request. You can also use MyChart to communicate with me or any office staff.  Please try these tips to maintain a healthy lifestyle: It is important that you exercise regularly at least 30 minutes 5 times a week. Think about what you will eat, plan ahead. Choose whole foods, & think  "clean, green, fresh or frozen" over canned, processed or packaged foods which are more sugary, salty, and fatty. 70 to 75% of food eaten should be fresh vegetables and protein. 2-3  meals daily with healthy snacks between meals, but must be whole fruit, protein or vegetables. Aim to eat over a 10 hour period when you are active, for example, 7am to 5pm, and then STOP after your last meal of the day, drinking only water.  Shorter eating windows, 6-8 hours, are showing benefits in heart disease and blood sugar regulation. Drink water every day! Shoot for 64 ounces daily = 8 cups, no other drink is as healthy! Fruit juice is best enjoyed in a healthy way, by EATING the fruit.

## 2022-03-29 ENCOUNTER — Encounter: Payer: Self-pay | Admitting: Pulmonary Disease

## 2022-03-29 ENCOUNTER — Ambulatory Visit: Payer: BC Managed Care – PPO | Admitting: Pulmonary Disease

## 2022-03-29 VITALS — BP 110/80 | HR 81 | Ht 72.0 in | Wt 186.8 lb

## 2022-03-29 DIAGNOSIS — J329 Chronic sinusitis, unspecified: Secondary | ICD-10-CM

## 2022-03-29 DIAGNOSIS — J4520 Mild intermittent asthma, uncomplicated: Secondary | ICD-10-CM | POA: Diagnosis not present

## 2022-03-29 DIAGNOSIS — J410 Simple chronic bronchitis: Secondary | ICD-10-CM

## 2022-03-29 DIAGNOSIS — J302 Other seasonal allergic rhinitis: Secondary | ICD-10-CM

## 2022-03-29 MED ORDER — MONTELUKAST SODIUM 10 MG PO TABS: 10.0000 mg | ORAL_TABLET | Freq: Every day | ORAL | 11 refills | Status: DC

## 2022-03-29 MED ORDER — ARNUITY ELLIPTA 100 MCG/ACT IN AEPB: 1.0000 | INHALATION_SPRAY | Freq: Every day | RESPIRATORY_TRACT | 6 refills | Status: DC

## 2022-03-29 MED ORDER — ALBUTEROL SULFATE HFA 108 (90 BASE) MCG/ACT IN AERS: 2.0000 | INHALATION_SPRAY | Freq: Four times a day (QID) | RESPIRATORY_TRACT | 6 refills | Status: AC | PRN

## 2022-03-29 NOTE — Patient Instructions (Addendum)
Thank you for visiting Dr. Valeta Harms at [facility]. Today we recommend the following: Orders Placed This Encounter  Procedures   Pulmonary Function Test   Meds ordered this encounter  Medications   albuterol (VENTOLIN HFA) 108 (90 Base) MCG/ACT inhaler    Sig: Inhale 2 puffs into the lungs every 6 (six) hours as needed for wheezing or shortness of breath.    Dispense:  8 g    Refill:  6   Fluticasone Furoate (ARNUITY ELLIPTA) 100 MCG/ACT AEPB    Sig: Inhale 1 puff into the lungs daily.    Dispense:  30 each    Refill:  6   montelukast (SINGULAIR) 10 MG tablet    Sig: Take 1 tablet (10 mg total) by mouth at bedtime.    Dispense:  30 tablet    Refill:  11   Return in about 3 months (around 06/28/2022) for with APP or Dr. Valeta Harms.    Please do your part to reduce the spread of COVID-19.

## 2022-03-29 NOTE — Progress Notes (Signed)
Synopsis: Referred in Jan 2024 for lung nodule by Dulce Sellar, NP  Subjective:   PATIENT ID: Paul Vaughn GENDER: male DOB: 09-20-1980, MRN: [id number]  Chief Complaint  Patient presents with   Consult    Chronic bronchitis     42 yo M, PMH no prior medical problems. 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 3-4 times per year with URI symptoms, nasal congestion. Usually have issues seasonally which is worse in the spring and fall. He is allergic to cats and avoids them. No previous history of asthma. Dad had sinus issues. He saw an allergist several years ago 2018 and had skin testing.  He states that if one of his kids get sick at home he is always the person that picks up the cold.  It usually goes down to his chest he has congestion and cough sometimes last 6 to 8 weeks.  This happens several times a year.  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  He also had some unique exposures 1 of which she was in a water based area and pool of water for several hours and ended up developing a bacterial infection on the skin surface.  He was treated for that while he was there.    Past Medical History:  Diagnosis Date   Strain of calf muscle, initial encounter 08/07/2017     No family history on file.   Past Surgical History:  Procedure Laterality Date   HERNIA REPAIR     2012   VASECTOMY     2016    Social History   Socioeconomic History   Marital status: Married    Spouse name: Not on file   Number of children: Not on file   Years of education: Not on file   Highest education level: Not on file  Occupational History   Not on file  Tobacco Use   Smoking status: Never   Smokeless tobacco: Current    Types: Chew  Vaping Use   Vaping Use: Never used  Substance and  Sexual Activity   Alcohol use: Never   Drug use: Never   Sexual activity: Yes    Comment: Vasectomy  Other Topics Concern   Not on file  Social History Narrative   Not on file   Social Determinants of Health   Financial Resource Strain: Not on file  Food Insecurity: Not on file  Transportation Needs: Not on file  Physical Activity: Not on file  Stress: Not on file  Social Connections: Not on file  Intimate Partner Violence: Not on file     No Known Allergies   Outpatient Medications Prior to Visit  Medication Sig Dispense Refill   cetirizine (ZYRTEC) 10 MG chewable tablet Chew 10 mg by mouth daily.     loratadine-pseudoephedrine (CLARITIN-D 24-HOUR) 10-240 MG 24 hr tablet Take 1 tablet by mouth daily.     amoxicillin-clavulanate (AUGMENTIN) 875-125 MG tablet Take 1 tablet by mouth 2 (two) times daily. (Patient not taking: Reported on 03/29/2022)     benzonatate (TESSALON) 100 MG capsule Take 1 capsule (100 mg total) by mouth 2 (two) times daily as needed for cough. (Patient not taking: Reported on 03/29/2022) 20 capsule 0   No facility-administered medications prior to visit.  Review of Systems  Constitutional:  Negative for chills, fever, malaise/fatigue and weight loss.  HENT:  Negative for hearing loss, sore throat and tinnitus.   Eyes:  Negative for blurred vision and double vision.  Respiratory:  Positive for cough, sputum production and shortness of breath. Negative for hemoptysis, wheezing and stridor.   Cardiovascular:  Negative for chest pain, palpitations, orthopnea, leg swelling and PND.  Gastrointestinal:  Negative for abdominal pain, constipation, diarrhea, heartburn, nausea and vomiting.  Genitourinary:  Negative for dysuria, hematuria and urgency.  Musculoskeletal:  Negative for joint pain and myalgias.  Skin:  Negative for itching and rash.  Neurological:  Negative for dizziness, tingling, weakness and headaches.  Endo/Heme/Allergies:  Negative for  environmental allergies. Does not bruise/bleed easily.  Psychiatric/Behavioral:  Negative for depression. The patient is not nervous/anxious and does not have insomnia.   All other systems reviewed and are negative.    Objective:  Physical Exam Vitals reviewed.  Constitutional:      General: He is not in acute distress.    Appearance: He is well-developed.  HENT:     Head: Normocephalic and atraumatic.  Eyes:     General: No scleral icterus.    Conjunctiva/sclera: Conjunctivae normal.     Pupils: Pupils are equal, round, and reactive to light.  Neck:     Vascular: No JVD.     Trachea: No tracheal deviation.  Cardiovascular:     Rate and Rhythm: Normal rate and regular rhythm.     Heart sounds: Normal heart sounds. No murmur heard. Pulmonary:     Effort: Pulmonary effort is normal. No tachypnea, accessory muscle usage or respiratory distress.     Breath sounds: No stridor. No wheezing, rhonchi or rales.  Abdominal:     General: There is no distension.     Palpations: Abdomen is soft.     Tenderness: There is no abdominal tenderness.  Musculoskeletal:        General: No tenderness.     Cervical back: Neck supple.  Lymphadenopathy:     Cervical: No cervical adenopathy.  Skin:    General: Skin is warm and dry.     Capillary Refill: Capillary refill takes less than 2 seconds.     Findings: No rash.  Neurological:     Mental Status: He is alert and oriented to person, place, and time.  Psychiatric:        Behavior: Behavior normal.      Vitals:   03/29/22 1354  BP: 110/80  Pulse: 81  SpO2: 98%  Weight: 186 lb 12.8 oz (84.7 kg)  Height: 6' (1.829 m)   98% on RA BMI Readings from Last 3 Encounters:  03/29/22 25.33 kg/m  02/28/22 25.06 kg/m   Wt Readings from Last 3 Encounters:  03/29/22 186 lb 12.8 oz (84.7 kg)  02/28/22 184 lb 12.8 oz (83.8 kg)     CBC No results found for: "WBC", "RBC", "HGB", "HCT", "PLT", "MCV", "MCH", "MCHC", "RDW", "LYMPHSABS",  "MONOABS", "EOSABS", "BASOSABS"   Chest Imaging: No recent chest imaging  Pulmonary Functions Testing Results:     No data to display          FeNO:   Pathology:   Echocardiogram:   Heart Catheterization: Prescription summary    Assessment & Plan:     ICD-10-CM   1. Mild intermittent asthma without complication  N47.09 albuterol (VENTOLIN HFA) 108 (90 Base) MCG/ACT inhaler    Fluticasone Furoate (ARNUITY ELLIPTA) 100 MCG/ACT AEPB  montelukast (SINGULAIR) 10 MG tablet    Pulmonary Function Test    2. Simple chronic bronchitis (HCC)  J41.0 albuterol (VENTOLIN HFA) 108 (90 Base) MCG/ACT inhaler    Fluticasone Furoate (ARNUITY ELLIPTA) 100 MCG/ACT AEPB    montelukast (SINGULAIR) 10 MG tablet    3. Seasonal allergies  J30.2     4. Sinusitis, unspecified chronicity, unspecified location  J32.9       Discussion: This is a 42 year old gentleman, recurrent respiratory symptoms.  It seems like he has reactive airway.  He is very atopic at baseline.  He has recurrent allergic response to various environmental allergens including grasses, trees, cats.  He seen an allergist in the past.  He has never been on any treatments for asthma before.  He does use a as needed albuterol when he has these episodes of cough and congestion that last for 6 to 8 weeks.  He is not really sure it has helped.  Plan: Continue daily antihistamine Continue use of albuterol as needed Start Arnuity inhaler once daily Start Singulair once daily Follow-up with [place] in 3 months to see if we have been successful in preventing a recurrent respiratory exacerbation.  If he has recurrent symptoms I would consider stepping him up to Symbicort 160 2 puffs twice daily with spacer  RTC 3 months   Current Outpatient Medications:    albuterol (VENTOLIN HFA) 108 (90 Base) MCG/ACT inhaler, Inhale 2 puffs into the lungs every 6 (six) hours as needed for wheezing or shortness of breath., Disp: 8 g, Rfl: 6    cetirizine (ZYRTEC) 10 MG chewable tablet, Chew 10 mg by mouth daily., Disp: , Rfl:    Fluticasone Furoate (ARNUITY ELLIPTA) 100 MCG/ACT AEPB, Inhale 1 puff into the lungs daily., Disp: 30 each, Rfl: 6   loratadine-pseudoephedrine (CLARITIN-D 24-HOUR) 10-240 MG 24 hr tablet, Take 1 tablet by mouth daily., Disp: , Rfl:    montelukast (SINGULAIR) 10 MG tablet, Take 1 tablet (10 mg total) by mouth at bedtime., Disp: 30 tablet, Rfl: 11   amoxicillin-clavulanate (AUGMENTIN) 875-125 MG tablet, Take 1 tablet by mouth 2 (two) times daily. (Patient not taking: Reported on 03/29/2022), Disp: , Rfl:    benzonatate (TESSALON) 100 MG capsule, Take 1 capsule (100 mg total) by mouth 2 (two) times daily as needed for cough. (Patient not taking: Reported on 03/29/2022), Disp: 20 capsule, Rfl: 0    Garner Nash, DO [facility] 03/29/2022 2:29 PM

## 2022-06-11 DIAGNOSIS — B9689 Other specified bacterial agents as the cause of diseases classified elsewhere: Secondary | ICD-10-CM | POA: Diagnosis not present

## 2022-06-11 DIAGNOSIS — J069 Acute upper respiratory infection, unspecified: Secondary | ICD-10-CM | POA: Diagnosis not present

## 2022-07-12 ENCOUNTER — Ambulatory Visit (INDEPENDENT_AMBULATORY_CARE_PROVIDER_SITE_OTHER): Payer: BC Managed Care – PPO | Admitting: Pulmonary Disease

## 2022-07-12 ENCOUNTER — Ambulatory Visit: Payer: BC Managed Care – PPO | Admitting: Pulmonary Disease

## 2022-07-12 ENCOUNTER — Encounter: Payer: Self-pay | Admitting: Pulmonary Disease

## 2022-07-12 VITALS — BP 100/80 | HR 75 | Ht 72.0 in | Wt 181.2 lb

## 2022-07-12 DIAGNOSIS — J302 Other seasonal allergic rhinitis: Secondary | ICD-10-CM | POA: Diagnosis not present

## 2022-07-12 DIAGNOSIS — J4540 Moderate persistent asthma, uncomplicated: Secondary | ICD-10-CM | POA: Diagnosis not present

## 2022-07-12 DIAGNOSIS — J4520 Mild intermittent asthma, uncomplicated: Secondary | ICD-10-CM

## 2022-07-12 DIAGNOSIS — J410 Simple chronic bronchitis: Secondary | ICD-10-CM | POA: Diagnosis not present

## 2022-07-12 LAB — PULMONARY FUNCTION TEST
DL/VA % pred: 97 %
DL/VA: 4.46 ml/min/mmHg/L
DLCO cor % pred: 101 %
DLCO cor: 33.28 ml/min/mmHg
DLCO unc % pred: 101 %
DLCO unc: 33.28 ml/min/mmHg
FEF 25-75 Pre: 3.64 L/sec
FEF2575-%Pred-Pre: 89 %
FEV1-%Pred-Pre: 101 %
FEV1-Pre: 4.47 L
FEV1FVC-%Pred-Pre: 95 %
FEV6-%Pred-Pre: 106 %
FEV6-Pre: 5.82 L
FEV6FVC-%Pred-Pre: 101 %
FVC-%Pred-Pre: 105 %
FVC-Pre: 5.91 L
Pre FEV1/FVC ratio: 76 %
Pre FEV6/FVC Ratio: 98 %
RV % pred: 82 %
RV: 1.64 L
TLC % pred: 104 %
TLC: 7.64 L

## 2022-07-12 MED ORDER — FLUTICASONE PROPIONATE 50 MCG/ACT NA SUSP: 2.0000 | Freq: Every day | NASAL | 2 refills | Status: AC

## 2022-07-12 MED ORDER — BUDESONIDE-FORMOTEROL FUMARATE 160-4.5 MCG/ACT IN AERO: 2.0000 | INHALATION_SPRAY | Freq: Two times a day (BID) | RESPIRATORY_TRACT | 6 refills | Status: AC

## 2022-07-12 NOTE — Patient Instructions (Addendum)
Thank you for visiting Dr. Tonia Brooms at [facility]. Today we recommend the following:  Symbicort daily  Flonase daily  Zyrtec daily  Singulair daily   Meds ordered this encounter  Medications   budesonide-formoterol (SYMBICORT) 160-4.5 MCG/ACT inhaler    Sig: Inhale 2 puffs into the lungs in the morning and at bedtime.    Dispense:  1 each    Refill:  6   fluticasone (FLONASE) 50 MCG/ACT nasal spray    Sig: Place 2 sprays into both nostrils daily.    Dispense:  16 g    Refill:  2   Return in about 8 weeks (around 09/06/2022) for with APP or Dr. Tonia Brooms.    Please do your part to reduce the spread of COVID-19.

## 2022-07-12 NOTE — Patient Instructions (Signed)
Full PFT performed today. °

## 2022-07-12 NOTE — Progress Notes (Signed)
Synopsis: Referred in Jan 2024 for lung nodule by Dulce Sellar, NP  Subjective:   PATIENT ID: Paul Vaughn GENDER: male DOB: [birth year], MRN: [id number]  Chief Complaint  Patient presents with   Follow-up    F/up on PFT    42 yo M, PMH no prior medical problems. 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 3-4 times per year with URI symptoms, nasal congestion. Usually have issues seasonally which is worse in the spring and fall. He is allergic to cats and avoids them. No previous history of asthma. Dad had sinus issues. He saw an allergist several years ago 2018 and had skin testing.  He states that if one of his kids get sick at home he is always the person that picks up the cold.  It usually goes down to his chest he has congestion and cough sometimes last 6 to 8 weeks.  This happens several times a year.  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  He also had some unique exposures 1 of which she was in a water based area and pool of water for several hours and ended up developing a bacterial infection on the skin surface.  He was treated for that while he was there.  OV 07/12/2022: Here today for follow-up after new PFTs complete.  PFTs show normal lung function, normal spirometry, normal TLC normal DLCO.  Patient has no respiratory complaints today except he did have a recent exacerbation that required urgent visit in urgent care.  He usually still gets sick up to 4-5 times a year.  He uses inhalers for short.  He used his other Singulair.  He is back to a regimen of Sudafed and antihistamines.  He has had skin testing in the past was allergic to several environmental allergens.    Past Medical History:  Diagnosis Date   Strain of calf muscle, initial encounter 08/07/2017     No  family history on file.   Past Surgical History:  Procedure Laterality Date   HERNIA REPAIR     2012   VASECTOMY     2016    Social History   Socioeconomic History   Marital status: Married    Spouse name: Not on file   Number of children: Not on file   Years of education: Not on file   Highest education level: Not on file  Occupational History   Not on file  Tobacco Use   Smoking status: Never   Smokeless tobacco: Current    Types: Chew  Vaping Use   Vaping Use: Never used  Substance and Sexual Activity   Alcohol use: Never   Drug use: Never   Sexual activity: Yes    Comment: Vasectomy  Other Topics Concern   Not on file  Social History Narrative   Not on file   Social Determinants of Health   Financial Resource Strain: Not on file  Food Insecurity: Not on file  Transportation Needs: Not on file  Physical Activity: Not on file  Stress: Not on file  Social Connections: Not on file  Intimate Partner Violence: Not on file     No Known Allergies   Outpatient Medications Prior to Visit  Medication Sig Dispense Refill   albuterol (VENTOLIN HFA) 108 (90  Base) MCG/ACT inhaler Inhale 2 puffs into the lungs every 6 (six) hours as needed for wheezing or shortness of breath. 8 g 6   cetirizine (ZYRTEC) 10 MG chewable tablet Chew 10 mg by mouth daily.     Fluticasone Furoate (ARNUITY ELLIPTA) 100 MCG/ACT AEPB Inhale 1 puff into the lungs daily. 30 each 6   loratadine-pseudoephedrine (CLARITIN-D 24-HOUR) 10-240 MG 24 hr tablet Take 1 tablet by mouth daily.     montelukast (SINGULAIR) 10 MG tablet Take 1 tablet (10 mg total) by mouth at bedtime. 30 tablet 11   amoxicillin-clavulanate (AUGMENTIN) 875-125 MG tablet Take 1 tablet by mouth 2 (two) times daily. (Patient not taking: Reported on 03/29/2022)     benzonatate (TESSALON) 100 MG capsule Take 1 capsule (100 mg total) by mouth 2 (two) times daily as needed for cough. (Patient not taking: Reported on 03/29/2022) 20 capsule  0   No facility-administered medications prior to visit.    Review of Systems  Constitutional:  Negative for chills, fever, malaise/fatigue and weight loss.  HENT:  Negative for hearing loss, sore throat and tinnitus.   Eyes:  Negative for blurred vision and double vision.  Respiratory:  Positive for cough. Negative for hemoptysis, sputum production, shortness of breath, wheezing and stridor.   Cardiovascular:  Negative for chest pain, palpitations, orthopnea, leg swelling and PND.  Gastrointestinal:  Negative for abdominal pain, constipation, diarrhea, heartburn, nausea and vomiting.  Genitourinary:  Negative for dysuria, hematuria and urgency.  Musculoskeletal:  Negative for joint pain and myalgias.  Skin:  Negative for itching and rash.  Neurological:  Negative for dizziness, tingling, weakness and headaches.  Endo/Heme/Allergies:  Negative for environmental allergies. Does not bruise/bleed easily.  Psychiatric/Behavioral:  Negative for depression. The patient is not nervous/anxious and does not have insomnia.   All other systems reviewed and are negative.    Objective:  Physical Exam Vitals reviewed.  Constitutional:      General: He is not in acute distress.    Appearance: He is well-developed.  HENT:     Head: Normocephalic and atraumatic.  Eyes:     General: No scleral icterus.    Conjunctiva/sclera: Conjunctivae normal.     Pupils: Pupils are equal, round, and reactive to light.  Neck:     Vascular: No JVD.     Trachea: No tracheal deviation.  Cardiovascular:     Rate and Rhythm: Normal rate and regular rhythm.     Heart sounds: Normal heart sounds. No murmur heard. Pulmonary:     Effort: Pulmonary effort is normal. No tachypnea, accessory muscle usage or respiratory distress.     Breath sounds: Normal breath sounds. No stridor. No wheezing, rhonchi or rales.  Abdominal:     General: Bowel sounds are normal. There is no distension.     Palpations: Abdomen is soft.      Tenderness: There is no abdominal tenderness.  Musculoskeletal:        General: No tenderness.     Cervical back: Neck supple.  Lymphadenopathy:     Cervical: No cervical adenopathy.  Skin:    General: Skin is warm and dry.     Capillary Refill: Capillary refill takes less than 2 seconds.     Findings: No rash.  Neurological:     Mental Status: He is alert and oriented to person, place, and time.  Psychiatric:        Behavior: Behavior normal.      Vitals:   07/12/22 1301  BP:  100/80  Pulse: 75  SpO2: 99%  Weight: 181 lb 3.2 oz (82.2 kg)  Height: 6' (1.829 m)   99% on RA BMI Readings from Last 3 Encounters:  07/12/22 24.58 kg/m  03/29/22 25.33 kg/m  02/28/22 25.06 kg/m   Wt Readings from Last 3 Encounters:  07/12/22 181 lb 3.2 oz (82.2 kg)  03/29/22 186 lb 12.8 oz (84.7 kg)  02/28/22 184 lb 12.8 oz (83.8 kg)     CBC No results found for: "WBC", "RBC", "HGB", "HCT", "PLT", "MCV", "MCH", "MCHC", "RDW", "LYMPHSABS", "MONOABS", "EOSABS", "BASOSABS"   Chest Imaging: No recent chest imaging  Pulmonary Functions Testing Results:    Latest Ref Rng & Units 07/12/2022   11:53 AM  PFT Results  FVC-Pre L 5.91  P  FVC-Predicted Pre % 105  P  Pre FEV1/FVC % % 76  P  FEV1-Pre L 4.47  P  FEV1-Predicted Pre % 101  P  DLCO uncorrected ml/min/mmHg 33.28  P  DLCO UNC% % 101  P  DLCO corrected ml/min/mmHg 33.28  P  DLCO COR %Predicted % 101  P  DLVA Predicted % 97  P  TLC L 7.64  P  TLC % Predicted % 104  P  RV % Predicted % 82  P    P Preliminary result    FeNO:   Pathology:   Echocardiogram:   Heart Catheterization: Prescription summary    Assessment & Plan:     ICD-10-CM   1. Moderate persistent asthma without complication  J45.40     2. Simple chronic bronchitis (HCC)  J41.0     3. Seasonal allergies  J30.2       Discussion:  This is a 42 year old gentleman, recurrent respiratory symptoms.  Appears to have very atopic baseline.  Recurrent  allergic symptoms with response to various environmental allergens and to grasses trees cats.  He has seen allergist in the past.  Not sure that the Arnuity made much difference in his symptomatology.  Plan: Start Symbicort 160, 2 puffs twice daily Daily antihistamine Flonase intranasal steroids Singulair daily Stay on this regimen every single day for the next 3 months. If he still having breakthrough symptoms we will check RAST panel IgE and CBC with differential for consideration of biologic initiation.  RTC in 3 months with APP or Dr. Tonia Brooms.   Current Outpatient Medications:    albuterol (VENTOLIN HFA) 108 (90 Base) MCG/ACT inhaler, Inhale 2 puffs into the lungs every 6 (six) hours as needed for wheezing or shortness of breath., Disp: 8 g, Rfl: 6   budesonide-formoterol (SYMBICORT) 160-4.5 MCG/ACT inhaler, Inhale 2 puffs into the lungs in the morning and at bedtime., Disp: 1 each, Rfl: 6   cetirizine (ZYRTEC) 10 MG chewable tablet, Chew 10 mg by mouth daily., Disp: , Rfl:    fluticasone (FLONASE) 50 MCG/ACT nasal spray, Place 2 sprays into both nostrils daily., Disp: 16 g, Rfl: 2   Fluticasone Furoate (ARNUITY ELLIPTA) 100 MCG/ACT AEPB, Inhale 1 puff into the lungs daily., Disp: 30 each, Rfl: 6   loratadine-pseudoephedrine (CLARITIN-D 24-HOUR) 10-240 MG 24 hr tablet, Take 1 tablet by mouth daily., Disp: , Rfl:    montelukast (SINGULAIR) 10 MG tablet, Take 1 tablet (10 mg total) by mouth at bedtime., Disp: 30 tablet, Rfl: 11   amoxicillin-clavulanate (AUGMENTIN) 875-125 MG tablet, Take 1 tablet by mouth 2 (two) times daily. (Patient not taking: Reported on 03/29/2022), Disp: , Rfl:    benzonatate (TESSALON) 100 MG capsule, Take 1 capsule (100  mg total) by mouth 2 (two) times daily as needed for cough. (Patient not taking: Reported on 03/29/2022), Disp: 20 capsule, Rfl: 0    Josephine Igo, DO [facility] 07/12/2022 1:37 PM

## 2022-07-12 NOTE — Progress Notes (Signed)
Full PFT performed today. °

## 2022-07-20 DIAGNOSIS — M79672 Pain in left foot: Secondary | ICD-10-CM | POA: Diagnosis not present

## 2023-05-17 ENCOUNTER — Ambulatory Visit: Payer: BC Managed Care – PPO | Admitting: Primary Care

## 2023-05-17 ENCOUNTER — Encounter: Payer: Self-pay | Admitting: Primary Care

## 2023-05-17 VITALS — BP 126/80 | HR 99 | Ht 72.0 in | Wt 177.6 lb

## 2023-05-17 DIAGNOSIS — G479 Sleep disorder, unspecified: Secondary | ICD-10-CM | POA: Diagnosis not present

## 2023-05-17 DIAGNOSIS — F17220 Nicotine dependence, chewing tobacco, uncomplicated: Secondary | ICD-10-CM

## 2023-05-17 DIAGNOSIS — G4709 Other insomnia: Secondary | ICD-10-CM

## 2023-05-17 DIAGNOSIS — J310 Chronic rhinitis: Secondary | ICD-10-CM | POA: Diagnosis not present

## 2023-05-17 DIAGNOSIS — J45909 Unspecified asthma, uncomplicated: Secondary | ICD-10-CM

## 2023-05-17 DIAGNOSIS — Z9189 Other specified personal risk factors, not elsewhere classified: Secondary | ICD-10-CM

## 2023-05-17 NOTE — Progress Notes (Signed)
 @Patient  ID: Paul Vaughn, male    DOB: [birth year], 43 y.o.   MRN: [id number]  Chief Complaint  Patient presents with   Consult    Referring provider: Dulce Sellar, NP  HPI: 43 year old male, never smoked.  Past medical history significant for asthma and chronic bronchitis.  Former patient of Dr. Tonia Brooms.  05/17/2023 Discussed the use of AI scribe software for clinical note transcription with the patient, who gave verbal consent to proceed.  Paul Vaughn is a 43 year old male with chronic respiratory issues who presents with sleep disturbances.  He experiences sleep disturbances characterized by difficulty falling asleep, staying asleep, and waking up feeling tired. He typically goes to bed at 10 PM, takes one to two hours to fall asleep, and wakes up at 6 AM. He wakes up one to three times per night and often sleeps through alarms. Over-the-counter sleep aids like melatonin help him fall asleep faster but cause him to wake up by midnight. He denies being a routine snorer but occasionally snores, especially when lying on his back. He has experienced waking up gasping or choking, usually due to mucus. He has not undergone a sleep study before.  He has a long-standing history of respiratory issues, including chronic bronchitis, sinusitis, rhinitis, and asthma. He manages these conditions with medications such as Zyrtec, Claritin D, and Allegra D, which he rotates daily throughout the year. He also uses Symbicort as a maintenance inhaler daily. He has undergone pulmonary function tests in the past year and a half and has been working with the [place] for his respiratory conditions.  His family history is significant for sleep apnea, as his father has the condition.      No Known Allergies  Immunization History  Administered Date(s) Administered   Hep A / Hep B 12/22/2002, 11/08/2003, 08/22/2004   IPV 12/22/2002   Influenza Nasal 01/10/2006   Influenza,inj,quad, With Preservative  06/29/2022   MMR 12/22/2002, 06/29/2022   Meningococcal polysaccharide vaccine (MPSV4) 12/22/2002   PPD Test 12/01/2003, 01/10/2006   Smallpox 10/18/2004   Td 12/22/2002   Typhoid Inactivated 08/22/2004   Varicella 06/29/2022    Past Medical History:  Diagnosis Date   Strain of calf muscle, initial encounter 08/07/2017    Tobacco History: Social History   Tobacco Use  Smoking Status Never  Smokeless Tobacco Current   Types: Chew   Ready to quit: Not Answered Counseling given: Not Answered   Outpatient Medications Prior to Visit  Medication Sig Dispense Refill   albuterol (VENTOLIN HFA) 108 (90 Base) MCG/ACT inhaler Inhale 2 puffs into the lungs every 6 (six) hours as needed for wheezing or shortness of breath. 8 g 6   budesonide-formoterol (SYMBICORT) 160-4.5 MCG/ACT inhaler Inhale 2 puffs into the lungs in the morning and at bedtime. 1 each 6   cetirizine (ZYRTEC) 10 MG chewable tablet Chew 10 mg by mouth daily.     fluticasone (FLONASE) 50 MCG/ACT nasal spray Place 2 sprays into both nostrils daily. 16 g 2   Fluticasone Furoate (ARNUITY ELLIPTA) 100 MCG/ACT AEPB Inhale 1 puff into the lungs daily. 30 each 6   loratadine-pseudoephedrine (CLARITIN-D 24-HOUR) 10-240 MG 24 hr tablet Take 1 tablet by mouth daily.     montelukast (SINGULAIR) 10 MG tablet Take 1 tablet (10 mg total) by mouth at bedtime. 30 tablet 11   amoxicillin-clavulanate (AUGMENTIN) 875-125 MG tablet Take 1 tablet by mouth 2 (two) times daily. (Patient not taking: Reported on 05/17/2023)     benzonatate (TESSALON)  100 MG capsule Take 1 capsule (100 mg total) by mouth 2 (two) times daily as needed for cough. (Patient not taking: Reported on 05/17/2023) 20 capsule 0   No facility-administered medications prior to visit.   Review of Systems  Review of Systems  Constitutional:  Positive for fatigue.  HENT: Negative.    Respiratory: Negative.    Cardiovascular: Negative.   Psychiatric/Behavioral:  Positive for  sleep disturbance.     Physical Exam  BP 126/80 (BP Location: Left Arm, Patient Position: Sitting, Cuff Size: Normal)   Pulse 99   Ht 6' (1.829 m)   Wt 177 lb 9.6 oz (80.6 kg)   SpO2 100%   BMI 24.09 kg/m  Physical Exam Constitutional:      General: He is not in acute distress.    Appearance: Normal appearance. He is not ill-appearing.  HENT:     Head: Normocephalic and atraumatic.     Mouth/Throat:     Comments: Mallampati class I Cardiovascular:     Rate and Rhythm: Normal rate and regular rhythm.  Pulmonary:     Effort: Pulmonary effort is normal.     Breath sounds: Normal breath sounds. No wheezing or rhonchi.  Musculoskeletal:        General: Normal range of motion.  Skin:    General: Skin is warm and dry.  Neurological:     General: No focal deficit present.     Mental Status: He is alert and oriented to person, place, and time.  Psychiatric:        Mood and Affect: Mood normal.        Behavior: Behavior normal.        Thought Content: Thought content normal.        Judgment: Judgment normal.      Lab Results:  CBC No results found for: "WBC", "RBC", "HGB", "HCT", "PLT", "MCV", "MCH", "MCHC", "RDW", "LYMPHSABS", "MONOABS", "EOSABS", "BASOSABS"  BMET No results found for: "NA", "K", "CL", "CO2", "GLUCOSE", "BUN", "CREATININE", "CALCIUM", "GFRNONAA", "GFRAA"  BNP No results found for: "BNP"  ProBNP No results found for: "PROBNP"  Imaging: No results found.   Assessment & Plan:   No problem-specific Assessment & Plan notes found for this encounter.  Assessment and Plan    Sleep disturbance Chronic sleep disturbance with difficulty initiating sleep, nocturnal awakenings, daytime fatigue, and occasional gasping or choking upon waking. Possible sleep apnea due to family history and respiratory issues. Reviewed risks of untreated sleep apnea and possible treatment options. Home sleep study planned to rule out sleep apnea. - Order home sleep study to  rule out sleep apnea. - Advise side sleeping position. - Advise against excessive alcohol consumption before bed. - Caution against driving if experiencing significant sleepiness.  Chronic rhinitis, sinusitis, and bronchitis Managed with daily antihistamines and Symbicort. Allergy testing positive for environmental allergens. - Continue daily antihistamines, rotating between Zyrtec, Claritin D, and Allegra D. - Continue Symbicort as maintenance therapy. - Consider referral to ENT if symptoms persist or worsen.  Asthma Managed with Symbicort. No recent exacerbations. - Continue Symbicort as maintenance therapy.  Follow-up Follow-up plan discussed for after sleep study results. Treatment options based on sleep apnea diagnosis and severity. - Instruct him to send a message via MyChart two weeks after completing the sleep study to discuss results. - Plan for a virtual follow-up visit if sleep apnea is diagnosed to discuss treatment options such as oral appliance or CPAP.         Earnstine Regal  Clent Ridges, NP 05/17/2023

## 2023-05-17 NOTE — Patient Instructions (Addendum)
 Sleep apnea is defined as period of 10 seconds or longer when you stop breathing at night. This can happen multiple times a night. Dx sleep apnea is when this occurs more than 5 times an hour.    Mild OSA 5-15 apneic events an hour Moderate OSA 15-30 apneic events an hour Severe OSA > 30 apneic events an hour   Untreated sleep apnea puts you at higher risk for cardiac arrhythmias, pulmonary HTN, stroke and diabetes  Treatment options include weight loss, side sleeping position, oral appliance, CPAP therapy or referral to ENT for possible surgical options    Recommendations: Focus on side sleeping position or elevate head with wedge pillow 30 degrees Work on weight loss efforts if able  Do not drive if experiencing excessive daytime sleepiness of fatigue    Orders: Home sleep study re: loud snoring    Follow-up: Please call to schedule follow-up 2-3 weeks after completing home sleep study to review results and treatment if needed (can be virtual) CPAP.  Sleep Apnea  Sleep apnea is a condition that affects your breathing while you are sleeping. Your tongue or soft tissue in your throat may block the flow of air while you sleep. You may have shallow breathing or stop breathing for short periods of time. People with sleep apnea may snore loudly. There are three kinds of sleep apnea: Obstructive sleep apnea. This kind is caused by a blocked or collapsed airway. This is the most common. Central sleep apnea. This kind happens when the part of the brain that controls breathing does not send the correct signals to the muscles that control breathing. Mixed sleep apnea. This is a combination of obstructive and central sleep apnea. What are the causes? The most common cause of sleep apnea is a collapsed or blocked airway. What increases the risk? Being very overweight. Having family members with sleep apnea. Having a tongue or tonsils that are larger than normal. Having a small airway or  jaw problems. Being older. What are the signs or symptoms? Loud snoring. Restless sleep. Trouble staying asleep. Being sleepy or tired during the day. Waking up gasping or choking. Having a headache in the morning. Mood swings. Having a hard time remembering things and concentrating. How is this diagnosed? A medical history. A physical exam. A sleep study. This is also called a polysomnography test. This test is done at a sleep lab or in your home while you are sleeping. How is this treated? Treatment may include: Sleeping on your side. Losing weight if you're overweight. Wearing an oral appliance. This is a mouthpiece that moves your lower jaw forward. Using a positive airway pressure (PAP) device to keep your airways open while you sleep, such as: A continuous positive airway pressure (CPAP) device. This device gives forced air through a mask when you breathe out. This keeps your airways open. A bilevel positive airway pressure (BIPAP) device. This device gives forced air through a mask when you breathe in and when you breathe out to keep your airways open. Having surgery if other treatments do not work. If your sleep apnea is not treated, you may be at risk for: Heart failure. Heart attack. Stroke. Type 2 diabetes or a problem with your blood sugar called insulin resistance. Follow these instructions at home: Medicines Take your medicines only as told by your health care provider. Avoid alcohol, medicines to help you relax, and certain pain medicines. These may make sleep apnea worse. General instructions Do not smoke, vape, or use  products with nicotine or tobacco in them. If you need help quitting, talk with your provider. If you were given a PAP device to open your airway while you sleep, use it as told by your provider. If you're having surgery, make sure to tell your provider you have sleep apnea. You may need to bring your PAP device with you. Contact a health care  provider if: The PAP device that you were given to use during sleep bothers you or does not seem to be working. You do not feel better or you feel worse. Get help right away if: You have trouble breathing. You have chest pain. You have trouble talking. One side of your body feels weak. A part of your face is hanging down. These symptoms may be an emergency. Call 911 right away. Do not wait to see if the symptoms will go away. Do not drive yourself to the hospital. This information is not intended to replace advice given to you by your health care provider. Make sure you discuss any questions you have with your health care provider. Document Revised: 11/22/2022 Document Reviewed: 04/26/2022 Elsevier Patient Education  2024 [employer].

## 2023-05-31 ENCOUNTER — Ambulatory Visit: Admitting: Adult Health

## 2023-05-31 DIAGNOSIS — Z9189 Other specified personal risk factors, not elsewhere classified: Secondary | ICD-10-CM

## 2023-05-31 DIAGNOSIS — G4709 Other insomnia: Secondary | ICD-10-CM

## 2023-05-31 DIAGNOSIS — G479 Sleep disorder, unspecified: Secondary | ICD-10-CM

## 2023-07-01 NOTE — Telephone Encounter (Signed)
 The sleep study is still pending signature

## 2023-07-04 DIAGNOSIS — R0683 Snoring: Secondary | ICD-10-CM | POA: Diagnosis not present

## 2023-07-08 NOTE — Telephone Encounter (Signed)
 The sleep study has been read and it has been scanned into Epic

## 2023-07-09 NOTE — Progress Notes (Signed)
 Sleep study showed occasional apneas but not severe. AHI 4.4/hour/ Please schedule follow-up to discuss results and treatment options if needed

## 2023-07-09 NOTE — Telephone Encounter (Signed)
 This home sleep test was within normal limits. There were a few apnea events. Oxygen levels were normal. If concerns remain, we can see him again to discuss.

## 2023-07-09 NOTE — Telephone Encounter (Signed)
 Patient returning call for a Paul Vaughn concerning some results . Got patient transferred to [place] to speak with Paul Vaughn

## 2023-07-09 NOTE — Telephone Encounter (Signed)
 I called and spoke to pt. Pt has been scheduled to see Beth. NFN

## 2023-07-29 ENCOUNTER — Other Ambulatory Visit: Payer: Self-pay

## 2023-07-29 ENCOUNTER — Encounter: Payer: Self-pay | Admitting: Emergency Medicine

## 2023-07-29 ENCOUNTER — Ambulatory Visit: Admission: EM | Admit: 2023-07-29 | Discharge: 2023-07-29 | Disposition: A | Discharge status: Home / Self Care

## 2023-07-29 DIAGNOSIS — H6691 Otitis media, unspecified, right ear: Secondary | ICD-10-CM | POA: Diagnosis not present

## 2023-07-29 DIAGNOSIS — J4541 Moderate persistent asthma with (acute) exacerbation: Secondary | ICD-10-CM

## 2023-07-29 DIAGNOSIS — J069 Acute upper respiratory infection, unspecified: Secondary | ICD-10-CM | POA: Diagnosis not present

## 2023-07-29 HISTORY — DX: Unspecified asthma, uncomplicated: J45.909

## 2023-07-29 MED ORDER — BENZONATATE 100 MG PO CAPS: 100.0000 mg | ORAL_CAPSULE | Freq: Three times a day (TID) | ORAL | 0 refills | Status: AC | PRN

## 2023-07-29 MED ORDER — PREDNISONE 10 MG PO TABS: 40.0000 mg | ORAL_TABLET | Freq: Every day | ORAL | 0 refills | Status: AC

## 2023-07-29 MED ORDER — AMOXICILLIN-POT CLAVULANATE 875-125 MG PO TABS: 1.0000 | ORAL_TABLET | Freq: Two times a day (BID) | ORAL | 0 refills | Status: DC

## 2023-07-29 NOTE — Discharge Instructions (Addendum)
 Take the prednisone, Augmentin , Tessalon  Perles as directed.  Use your albuterol  inhaler as directed.  Follow up with your primary care provider tomorrow.  Go to the emergency department if you have worsening symptoms.

## 2023-07-29 NOTE — ED Triage Notes (Signed)
 Symptoms started 3 days ago. Nasal congestion, then goes to chest. Patient has a cough and has had a sore throat.  Bilateral ear popping.  Denies fever

## 2023-07-29 NOTE — ED Provider Notes (Signed)
 Paul Vaughn    CSN: [id number] Arrival date & time: 07/29/23  1605      History   Chief Complaint Chief Complaint  Patient presents with   Cough    HPI Paul Vaughn is a 43 y.o. male.  Patient presents with 3-day history of ear pain, sore throat, congestion, cough.  He reports tightness in his chest with coughing and deep breaths.  He has not used his albuterol  inhaler today.  No fever, wheezing, or chest pain. His medical history includes moderate persistent asthma.  The history is provided by the patient and medical records.    Past Medical History:  Diagnosis Date   Asthma    Strain of calf muscle, initial encounter 08/07/2017    Patient Active Problem List   Diagnosis Date Noted   Chronic bronchitis (HCC) 02/28/2022    Past Surgical History:  Procedure Laterality Date   HERNIA REPAIR     2012   VASECTOMY     2016       Home Medications    Prior to Admission medications   Medication Sig Start Date End Date Taking? Authorizing Provider  amoxicillin -clavulanate (AUGMENTIN ) 875-125 MG tablet Take 1 tablet by mouth every 12 (twelve) hours. 07/29/23  Yes Wellington Half, NP  benzonatate  (TESSALON ) 100 MG capsule Take 1 capsule (100 mg total) by mouth 3 (three) times daily as needed for cough. 07/29/23  Yes Wellington Half, NP  escitalopram (LEXAPRO) 5 MG tablet Take 5 mg by mouth daily. 07/19/23  Yes [provider]  predniSONE (DELTASONE) 10 MG tablet Take 4 tablets (40 mg total) by mouth daily for 5 days. 07/29/23 08/03/23 Yes Wellington Half, NP  albuterol  (VENTOLIN  HFA) 108 (90 Base) MCG/ACT inhaler Inhale 2 puffs into the lungs every 6 (six) hours as needed for wheezing or shortness of breath. 03/29/22   Icard, Dariel Edelson L, DO  budesonide -formoterol  (SYMBICORT ) 160-4.5 MCG/ACT inhaler Inhale 2 puffs into the lungs in the morning and at bedtime. 07/12/22   Icard, Lucie Ruts, DO  cetirizine (ZYRTEC) 10 MG chewable tablet Chew 10 mg by mouth daily.     [provider]  fluticasone  (FLONASE ) 50 MCG/ACT nasal spray Place 2 sprays into both nostrils daily. 07/12/22   Icard, Lucie Ruts, DO  Fluticasone  Furoate (ARNUITY ELLIPTA ) 100 MCG/ACT AEPB Inhale 1 puff into the lungs daily. 03/29/22   Icard, Lucie Ruts, DO  loratadine-pseudoephedrine (CLARITIN-D 24-HOUR) 10-240 MG 24 hr tablet Take 1 tablet by mouth daily.    [provider]  montelukast  (SINGULAIR ) 10 MG tablet Take 1 tablet (10 mg total) by mouth at bedtime. 03/29/22   Prudy Brownie, DO    Family History History reviewed. No pertinent family history.  Social History Social History   Tobacco Use   Smoking status: Never   Smokeless tobacco: Former  Building services engineer status: Never Used  Substance Use Topics   Alcohol use: Never   Drug use: Never     Allergies   Patient has no known allergies.   Review of Systems Review of Systems  Constitutional:  Negative for chills and fever.  HENT:  Positive for congestion, ear pain and sore throat.   Respiratory:  Positive for cough and chest tightness. Negative for shortness of breath and wheezing.      Physical Exam Triage Vital Signs ED Triage Vitals  Encounter Vitals Group     BP 07/29/23 1622 104/71     Systolic BP Percentile --  Diastolic BP Percentile --      Pulse Rate 07/29/23 1622 84     Resp 07/29/23 1622 18     Temp 07/29/23 1622 98.3 F (36.8 C)     Temp src --      SpO2 07/29/23 1622 97 %     Weight --      Height --      Head Circumference --      Peak Flow --      Pain Score 07/29/23 1643 5     Pain Loc --      Pain Education --      Exclude from Growth Chart --    No data found.  Updated Vital Signs BP 104/71   Pulse 84   Temp 98.3 F (36.8 C)   Resp 18   SpO2 97%   Visual Acuity Right Eye Distance:   Left Eye Distance:   Bilateral Distance:    Right Eye Near:   Left Eye Near:    Bilateral Near:     Physical Exam Constitutional:      General: He is not in acute  distress. HENT:     Right Ear: Tympanic membrane is erythematous.     Left Ear: Tympanic membrane normal.     Nose: Rhinorrhea present.     Mouth/Throat:     Mouth: Mucous membranes are moist.     Pharynx: Oropharynx is clear.  Cardiovascular:     Rate and Rhythm: Normal rate and regular rhythm.     Heart sounds: Normal heart sounds.  Pulmonary:     Effort: Pulmonary effort is normal. No respiratory distress.     Breath sounds: Normal breath sounds. No wheezing.  Neurological:     Mental Status: He is alert.      UC Treatments / Results  Labs (all labs ordered are listed, but only abnormal results are displayed) Labs Reviewed - No data to display  EKG   Radiology No results found.  Procedures Procedures (including critical care time)  Medications Ordered in UC Medications - No data to display  Initial Impression / Assessment and Plan / UC Course  I have reviewed the triage vital signs and the nursing notes.  Pertinent labs & imaging results that were available during my care of the patient were reviewed by me and considered in my medical decision making (see chart for details).    Acute upper respiratory infection, right otitis media, asthma exacerbation.  Afebrile and vital signs are stable.  O2 sat 97% on room air.  Treating with Augmentin  and Tessalon  Perles as patient reports these have worked well for him in the past.  Also treating with prednisone as patient reports tightness with his cough and deep breath.  He has history of moderate persistent asthma.  Encouraged him to use his albuterol  inhaler as directed.  Education provided on upper respiratory infection, otitis media, asthma.  Instructed him to follow-up with his PCP.  ED precautions given.  He agrees to plan of care.  Final Clinical Impressions(s) / UC Diagnoses   Final diagnoses:  Acute upper respiratory infection  Right otitis media, unspecified otitis media type  Moderate persistent asthma with  exacerbation     Discharge Instructions      Take the prednisone, Augmentin , Tessalon  Perles as directed.  Use your albuterol  inhaler as directed.  Follow up with your primary care provider tomorrow.  Go to the emergency department if you have worsening symptoms.  ED Prescriptions     Medication Sig Dispense Auth. Provider   amoxicillin -clavulanate (AUGMENTIN ) 875-125 MG tablet Take 1 tablet by mouth every 12 (twelve) hours. 14 tablet Leanor Proper H, NP   predniSONE (DELTASONE) 10 MG tablet Take 4 tablets (40 mg total) by mouth daily for 5 days. 20 tablet Wellington Half, NP   benzonatate  (TESSALON ) 100 MG capsule Take 1 capsule (100 mg total) by mouth 3 (three) times daily as needed for cough. 21 capsule Wellington Half, NP      PDMP not reviewed this encounter.   Wellington Half, NP 07/29/23 [phone]

## 2023-08-06 ENCOUNTER — Ambulatory Visit: Admitting: Primary Care

## 2023-08-06 ENCOUNTER — Encounter: Payer: Self-pay | Admitting: Primary Care

## 2023-08-06 VITALS — BP 116/80 | HR 82 | Temp 98.0°F | Ht 72.0 in | Wt 179.6 lb

## 2023-08-06 DIAGNOSIS — J4520 Mild intermittent asthma, uncomplicated: Secondary | ICD-10-CM | POA: Diagnosis not present

## 2023-08-06 DIAGNOSIS — G4709 Other insomnia: Secondary | ICD-10-CM

## 2023-08-06 NOTE — Patient Instructions (Signed)
 -  INSOMNIA: Insomnia is a condition where you have trouble falling asleep, staying asleep, or waking up feeling rested. Your sleep study showed occasional apneas but not frequent enough for diagnosis of obstructive sleep apnea. No significant oxygen desaturations. Low risk. We recommend trying to go to bed earlier to avoid deep sleep during your wake-up time, aiming for 7-8 hours of sleep per night. You can continue using melatonin if it helps you fall asleep, but avoid sleeping on your back and avoid alcohol before bed. Using a wedge pillow or MedCline for side sleeping may help reduce apneic events. If snoring becomes disruptive, consider seeing a dentist for a mouthpiece. Please contact us  via MyChart if your sleep issues persist so we can explore other options.  -ASTHMA: Asthma is a condition that affects your airways and can cause breathing difficulties. Your asthma is currently managed by your primary care provider, and there were no acute issues to address during this visit.  INSTRUCTIONS: Please follow the recommendations for improving your sleep and continue managing your asthma with your primary care provider. If your sleep issues persist, contact us  via MyChart for further evaluation and potential sleep aid options.  Follow-up If primary care is managing asthma, only return as needed

## 2023-08-06 NOTE — Progress Notes (Signed)
 @Patient  ID: Paul Vaughn, male    DOB: August 20, 1980, 43 y.o.   MRN: [id number]  Chief Complaint  Patient presents with   Follow-up    HST results     Referring provider: Versa Gore, NP  HPI: 43 year old male, never smoked.  Past medical history significant for asthma and chronic bronchitis.  Former patient of Dr. Thelda Finney.  Previous [facility] pulmonary encounter:  05/17/2023 Discussed the use of AI scribe software for clinical note transcription with the patient, who gave verbal consent to proceed.  Paul Vaughn is a 43 year old male with chronic respiratory issues who presents with sleep disturbances.  He experiences sleep disturbances characterized by difficulty falling asleep, staying asleep, and waking up feeling tired. He typically goes to bed at 10 PM, takes one to two hours to fall asleep, and wakes up at 6 AM. He wakes up one to three times per night and often sleeps through alarms. Over-the-counter sleep aids like melatonin help him fall asleep faster but cause him to wake up by midnight. He denies being a routine snorer but occasionally snores, especially when lying on his back. He has experienced waking up gasping or choking, usually due to mucus. He has not undergone a sleep study before.  He has a long-standing history of respiratory issues, including chronic bronchitis, sinusitis, rhinitis, and asthma. He manages these conditions with medications such as Zyrtec, Claritin D, and Allegra D, which he rotates daily throughout the year. He also uses Symbicort  as a maintenance inhaler daily. He has undergone pulmonary function tests in the past year and a half and has been working with the [place] for his respiratory conditions.  His family history is significant for sleep apnea, as his father has the condition  Sleep disturbance Chronic sleep disturbance with difficulty initiating sleep, nocturnal awakenings, daytime fatigue, and occasional gasping or choking upon waking. Possible sleep  apnea due to family history and respiratory issues. Reviewed risks of untreated sleep apnea and possible treatment options. Home sleep study planned to rule out sleep apnea. - Order home sleep study to rule out sleep apnea. - Advise side sleeping position. - Advise against excessive alcohol consumption before bed. - Caution against driving if experiencing significant sleepiness.   Chronic rhinitis, sinusitis, and bronchitis Managed with daily antihistamines and Symbicort . Allergy testing positive for environmental allergens. - Continue daily antihistamines, rotating between Zyrtec, Claritin D, and Allegra D. - Continue Symbicort  as maintenance therapy. - Consider referral to ENT if symptoms persist or worsen.   Asthma Managed with Symbicort . No recent exacerbations. - Continue Symbicort  as maintenance therapy.   08/06/2023- Interim hx  Discussed the use of AI scribe software for clinical note transcription with the patient, who gave verbal consent to proceed.  History of Present Illness   Paul Vaughn is a 43 year old male who presents for follow-up on a sleep study and management of insomnia.  He experiences difficulty falling asleep, staying asleep, and waking up feeling tired. He typically goes to bed between 10 PM and takes an hour or two to fall asleep, waking up at 6 AM. Over-the-counter melatonin helps him fall asleep faster but causes him to wake up by midnight. His deepest sleep occurs between 4 and 6 AM, making it difficult to wake up during this time. He sets multiple alarms but does not hear them. He can go to bed earlier but still struggles to fall asleep until 11 PM or later, regardless of when he lies down. He moves around a  lot during sleep and often wakes up in a different position than he started.  A recent sleep study showed an average of about 4.4 apneic events per hour and no significant oxygen desaturations. He experiences occasional snoring, which is not significant,  and has reported waking up gasping or choking in the past, usually due to mucus. No excessive daytime sleepiness is reported. He has three children, which influences his bedtime routine.   Asthma being managed by primary care, maintained on Symbicort .     No Known Allergies  Immunization History  Administered Date(s) Administered   Hep A / Hep B 12/22/2002, 11/08/2003, 08/22/2004   IPV 12/22/2002   Influenza Nasal 01/10/2006   Influenza,inj,quad, With Preservative 06/29/2022   MMR 12/22/2002, 06/29/2022   Meningococcal polysaccharide vaccine (MPSV4) 12/22/2002   PPD Test 12/01/2003, 01/10/2006   Smallpox 10/18/2004   Td 12/22/2002   Typhoid Inactivated 08/22/2004   Varicella 06/29/2022    Past Medical History:  Diagnosis Date   Asthma    Strain of calf muscle, initial encounter 08/07/2017    Tobacco History: Social History   Tobacco Use  Smoking Status Never  Smokeless Tobacco Former   Counseling given: Not Answered   Outpatient Medications Prior to Visit  Medication Sig Dispense Refill   albuterol  (VENTOLIN  HFA) 108 (90 Base) MCG/ACT inhaler Inhale 2 puffs into the lungs every 6 (six) hours as needed for wheezing or shortness of breath. 8 g 6   benzonatate  (TESSALON ) 100 MG capsule Take 1 capsule (100 mg total) by mouth 3 (three) times daily as needed for cough. 21 capsule 0   budesonide -formoterol  (SYMBICORT ) 160-4.5 MCG/ACT inhaler Inhale 2 puffs into the lungs in the morning and at bedtime. 1 each 6   cetirizine (ZYRTEC) 10 MG chewable tablet Chew 10 mg by mouth daily.     escitalopram (LEXAPRO) 5 MG tablet Take 5 mg by mouth daily.     fluticasone  (FLONASE ) 50 MCG/ACT nasal spray Place 2 sprays into both nostrils daily. 16 g 2   loratadine-pseudoephedrine (CLARITIN-D 24-HOUR) 10-240 MG 24 hr tablet Take 1 tablet by mouth daily.     amoxicillin -clavulanate (AUGMENTIN ) 875-125 MG tablet Take 1 tablet by mouth every 12 (twelve) hours. (Patient not taking: Reported on  08/06/2023) 14 tablet 0   Fluticasone  Furoate (ARNUITY ELLIPTA ) 100 MCG/ACT AEPB Inhale 1 puff into the lungs daily. (Patient not taking: Reported on 08/06/2023) 30 each 6   montelukast  (SINGULAIR ) 10 MG tablet Take 1 tablet (10 mg total) by mouth at bedtime. (Patient not taking: Reported on 08/06/2023) 30 tablet 11   No facility-administered medications prior to visit.      Review of Systems  Review of Systems  Constitutional: Negative.   HENT: Negative.    Respiratory: Negative.       Physical Exam  BP 116/80 (BP Location: Left Arm, Patient Position: Sitting, Cuff Size: Large)   Pulse 82   Temp 98 F (36.7 C) (Temporal)   Ht 6' (1.829 m)   Wt 179 lb 9.6 oz (81.5 kg)   SpO2 98%   BMI 24.36 kg/m  Physical Exam Constitutional:      Appearance: Normal appearance.  HENT:     Head: Normocephalic and atraumatic.  Cardiovascular:     Rate and Rhythm: Normal rate and regular rhythm.  Pulmonary:     Effort: Pulmonary effort is normal.     Breath sounds: Normal breath sounds.  Musculoskeletal:        General: Normal range of motion.  Skin:    General: Skin is warm and dry.  Neurological:     General: No focal deficit present.     Mental Status: He is alert and oriented to person, place, and time. Mental status is at baseline.  Psychiatric:        Mood and Affect: Mood normal.        Behavior: Behavior normal.        Thought Content: Thought content normal.        Judgment: Judgment normal.      Lab Results:  CBC No results found for: "WBC", "RBC", "HGB", "HCT", "PLT", "MCV", "MCH", "MCHC", "RDW", "LYMPHSABS", "MONOABS", "EOSABS", "BASOSABS"  BMET No results found for: "NA", "K", "CL", "CO2", "GLUCOSE", "BUN", "CREATININE", "CALCIUM", "GFRNONAA", "GFRAA"  BNP No results found for: "BNP"  ProBNP No results found for: "PROBNP"  Imaging: No results found.   Assessment & Plan:   1. Other insomnia (Primary)  2. Mild intermittent asthma without  complication  Assessment and Plan    Insomnia Chronic insomnia with difficulty initiating sleep and waking up fatigued. Sleep study revealed occasional apnea but not frequent enough for diagnosis of OSA; average AHI 4.4 apneic events per hour without notable oxygen desaturations. Deepest sleep occurs between 4 and 6 AM, complicating wakefulness. Melatonin aids in sleep onset but results in awakening by midnight. Likely related to sleep hygiene and delayed sleep phase. Minimal risk from occasional apneic events. Positional therapy may mitigate these events. - Recommend earlier bedtime to prevent deep sleep during wake-up time. - Advise 7-8 hours of sleep per night. - Suggest wedge pillow or MedCline for side sleeping to reduce apneic events. - Continue melatonin if beneficial for sleep onset. - Avoid supine position during sleep. - Avoid alcohol pre-sleep. - Consider dental consultation for mouthpiece if snoring is disruptive. - Contact via MyChart if issues persist for further evaluation and potential sleep aid options.  Asthma Mild intermittent asthma is primarily overseen by primary care provider. No acute issues addressed during this visit. - Continue Symbicort  160mcg two puffs q 12 hours  - Follow up as needed    Antonio Baumgarten, NP 08/06/2023

## 2023-08-15 DIAGNOSIS — N528 Other male erectile dysfunction: Secondary | ICD-10-CM | POA: Diagnosis not present

## 2023-08-22 DIAGNOSIS — L814 Other melanin hyperpigmentation: Secondary | ICD-10-CM | POA: Diagnosis not present

## 2023-08-22 DIAGNOSIS — D229 Melanocytic nevi, unspecified: Secondary | ICD-10-CM | POA: Diagnosis not present

## 2023-08-22 DIAGNOSIS — L601 Onycholysis: Secondary | ICD-10-CM | POA: Diagnosis not present

## 2023-08-22 DIAGNOSIS — L578 Other skin changes due to chronic exposure to nonionizing radiation: Secondary | ICD-10-CM | POA: Diagnosis not present

## 2023-08-22 DIAGNOSIS — L821 Other seborrheic keratosis: Secondary | ICD-10-CM | POA: Diagnosis not present

## 2023-09-16 DIAGNOSIS — R059 Cough, unspecified: Secondary | ICD-10-CM | POA: Diagnosis not present

## 2023-09-16 DIAGNOSIS — J019 Acute sinusitis, unspecified: Secondary | ICD-10-CM | POA: Diagnosis not present

## 2023-09-16 DIAGNOSIS — B9689 Other specified bacterial agents as the cause of diseases classified elsewhere: Secondary | ICD-10-CM | POA: Diagnosis not present

## 2023-10-01 DIAGNOSIS — B351 Tinea unguium: Secondary | ICD-10-CM | POA: Diagnosis not present

## 2023-10-01 DIAGNOSIS — R7989 Other specified abnormal findings of blood chemistry: Secondary | ICD-10-CM | POA: Diagnosis not present

## 2023-11-28 DIAGNOSIS — N528 Other male erectile dysfunction: Secondary | ICD-10-CM | POA: Diagnosis not present
